# Patient Record
Sex: Female | Born: 1975 | Race: White | Hispanic: No | Marital: Married | Smoking: Never smoker
Health system: Southern US, Community
[De-identification: ages and names within clinical notes are randomized; demographics above are authoritative.]

## PROBLEM LIST (undated history)

## (undated) DIAGNOSIS — E079 Disorder of thyroid, unspecified: Secondary | ICD-10-CM

## (undated) DIAGNOSIS — F32A Depression, unspecified: Secondary | ICD-10-CM

## (undated) DIAGNOSIS — F419 Anxiety disorder, unspecified: Secondary | ICD-10-CM

## (undated) DIAGNOSIS — G4730 Sleep apnea, unspecified: Secondary | ICD-10-CM

## (undated) DIAGNOSIS — D649 Anemia, unspecified: Secondary | ICD-10-CM

## (undated) HISTORY — PX: TONSILLECTOMY: SUR1361

## (undated) HISTORY — PX: WRIST FRACTURE SURGERY: SHX121

## (undated) HISTORY — PX: FOOT SURGERY: SHX648

## (undated) HISTORY — PX: WISDOM TOOTH EXTRACTION: SHX21

## (undated) HISTORY — PX: CHOLECYSTECTOMY: SHX55

## (undated) HISTORY — PX: CARPAL TUNNEL RELEASE: SHX101

---

## 2007-07-22 HISTORY — PX: GASTRIC BYPASS: SHX52

## 2016-07-21 HISTORY — PX: ABDOMINAL HYSTERECTOMY: SHX81

## 2021-02-19 ENCOUNTER — Encounter: Payer: Self-pay | Admitting: Podiatry

## 2021-02-19 ENCOUNTER — Other Ambulatory Visit: Payer: Self-pay | Admitting: Podiatry

## 2021-02-19 ENCOUNTER — Ambulatory Visit (INDEPENDENT_AMBULATORY_CARE_PROVIDER_SITE_OTHER): Payer: 59 | Admitting: Podiatry

## 2021-02-19 ENCOUNTER — Other Ambulatory Visit: Payer: Self-pay

## 2021-02-19 ENCOUNTER — Ambulatory Visit (INDEPENDENT_AMBULATORY_CARE_PROVIDER_SITE_OTHER): Payer: 59

## 2021-02-19 DIAGNOSIS — M722 Plantar fascial fibromatosis: Secondary | ICD-10-CM | POA: Diagnosis not present

## 2021-02-19 DIAGNOSIS — M778 Other enthesopathies, not elsewhere classified: Secondary | ICD-10-CM

## 2021-02-19 MED ORDER — TRIAMCINOLONE ACETONIDE 40 MG/ML IJ SUSP
20.0000 mg | Freq: Once | INTRAMUSCULAR | Status: AC
  Administered 2021-02-19: 20 mg

## 2021-02-19 MED ORDER — METHYLPREDNISOLONE 4 MG PO TBPK: ORAL_TABLET | ORAL | 0 refills | Status: DC

## 2021-02-19 MED ORDER — MELOXICAM 15 MG PO TABS: 15.0000 mg | ORAL_TABLET | Freq: Every day | ORAL | 3 refills | Status: DC

## 2021-02-19 NOTE — Patient Instructions (Signed)

## 2021-02-19 NOTE — Progress Notes (Signed)
Subjective:  Patient ID: Kimberly Lara, female    DOB: 09/26/1975,  MRN: [id number] HPI Chief Complaint  Patient presents with   Foot Pain    Plantar heel right - aching x 6 weeks, AM pain, tried Advil, Tylenol, ice, heat, elevation, compression sock, ordered night splint off [employer] (it comes today), tried new shoes-nothing has helped, trouble with arch supportive shoes due to trauma to left foot years ago   New Patient (Initial Visit)    45 y.o. female presents with the above complaint.   ROS: Denies fever chills nausea vomiting muscle aches pains calf pain back pain chest pain shortness of breath.  No past medical history on file.   Current Outpatient Medications:    meloxicam (MOBIC) 15 MG tablet, Take 1 tablet (15 mg total) by mouth daily., Disp: 30 tablet, Rfl: 3   methylPREDNISolone (MEDROL DOSEPAK) 4 MG TBPK tablet, 6 day dose pack - take as directed, Disp: 21 tablet, Rfl: 0   Multiple Vitamins-Minerals (PRESERVISION AREDS 2+MULTI VIT PO), , Disp: , Rfl:    meclizine (ANTIVERT) 25 MG tablet, Take by mouth 2 (two) times daily., Disp: , Rfl:    Prenatal Vit-Fe Fumarate-FA (M-NATAL PLUS) 27-1 MG TABS, Take 1 tablet by mouth daily as needed., Disp: , Rfl:    SYNTHROID 100 MCG tablet, Take 100 mcg by mouth daily., Disp: , Rfl:    traZODone (DESYREL) 50 MG tablet, Take 100 mg by mouth at bedtime as needed., Disp: , Rfl:    venlafaxine XR (EFFEXOR-XR) 150 MG 24 hr capsule, Take 150 mg by mouth daily., Disp: , Rfl:   No Known Allergies Review of Systems Objective:  There were no vitals filed for this visit.  General: Well developed, nourished, in no acute distress, alert and oriented x3   Dermatological: Skin is warm, dry and supple bilateral. Nails x 10 are well maintained; remaining integument appears unremarkable at this time. There are no open sores, no preulcerative lesions, no rash or signs of infection present.  Vascular: Dorsalis Pedis artery and Posterior Tibial artery  pedal pulses are 2/4 bilateral with immedate capillary fill time. Pedal hair growth present. No varicosities and no lower extremity edema present bilateral.   Neruologic: Grossly intact via light touch bilateral. Vibratory intact via tuning fork bilateral. Protective threshold with Semmes Wienstein monofilament intact to all pedal sites bilateral. Patellar and Achilles deep tendon reflexes 2+ bilateral. No Babinski or clonus noted bilateral.   Musculoskeletal: No gross boney pedal deformities bilateral. No pain, crepitus, or limitation noted with foot and ankle range of motion bilateral. Muscular strength 5/5 in all groups tested bilateral.  Severe pain on palpation medial calcaneal tubercle majority of her pain is lateral right.  Gait: Unassisted, Nonantalgic.    Radiographs:  Radiographs taken today demonstrate osseously mature individual soft tissue increase in density at the plantar fascial cannula insertion site of the right heel consistent with Planter fasciitis.  No other significant abnormalities are identified no acute findings are noted.  Assessment & Plan:   Assessment: Planter fasciitis right.  Plan: I injected the right heel today 20 mg Kenalog 5 mg Marcaine point of maximal tenderness.  Started her on methylprednisolone to be followed by meloxicam.  She will return to the office in 1 month.  She already has a night splint coming to her house that she ordered online.  We discussed appropriate shoe gear stretching exercise ice therapy and shoe gear modifications we also discussed etiology pathology conservative surgical therapies.  Garrel Ridgel, DPM

## 2021-03-26 ENCOUNTER — Other Ambulatory Visit: Payer: Self-pay

## 2021-03-26 ENCOUNTER — Ambulatory Visit (INDEPENDENT_AMBULATORY_CARE_PROVIDER_SITE_OTHER): Payer: 59 | Admitting: Podiatry

## 2021-03-26 DIAGNOSIS — M722 Plantar fascial fibromatosis: Secondary | ICD-10-CM | POA: Diagnosis not present

## 2021-03-26 MED ORDER — TRIAMCINOLONE ACETONIDE 40 MG/ML IJ SUSP
20.0000 mg | Freq: Once | INTRAMUSCULAR | Status: AC
  Administered 2021-03-26: 20 mg

## 2021-03-26 NOTE — Progress Notes (Signed)
She presents today states that her right Planter fasciitis pain was doing much better but after finishing the oral medication is started to become painful once again.  She states that it is still better than it was.  Objective: Vital signs are stable she alert oriented x3 presents with flip-flops or sandal type shoes today.  Still has tenderness on palpation medial calcaneal tubercle for but primarily lateral calcaneal tubercle.  Assessment plan fasciitis some resolution.  Plan: Reinjected the lateral aspect of the plantar fascial heel today 20 mg Kenalog 5 mg Marcaine point of maximal tenderness.  Tolerated procedure well follow-up with me in 1 month.  Continue all other conservative therapies.

## 2021-03-26 NOTE — Patient Instructions (Signed)

## 2021-05-02 ENCOUNTER — Ambulatory Visit (INDEPENDENT_AMBULATORY_CARE_PROVIDER_SITE_OTHER): Payer: 59 | Admitting: Podiatry

## 2021-05-02 ENCOUNTER — Other Ambulatory Visit: Payer: Self-pay

## 2021-05-02 DIAGNOSIS — M722 Plantar fascial fibromatosis: Secondary | ICD-10-CM | POA: Diagnosis not present

## 2021-05-02 MED ORDER — TRIAMCINOLONE ACETONIDE 10 MG/ML IJ SUSP
10.0000 mg | Freq: Once | INTRAMUSCULAR | Status: AC
  Administered 2021-05-02: 10 mg

## 2021-05-02 NOTE — Patient Instructions (Signed)
If was nice to meet you today. If you have any questions or any further concerns, please feel fee to give me a call. You can call our office at [phone] or please feel fee to send me a message through MyChart.    While at your visit today you received a steroid injection in your foot or ankle to help with your pain. Along with having the steroid medication there is some "numbing" medication in the shot that you received. Due to this you may notice some numbness to the area for the next couple of hours.    The actually benefit from the steroid injection may take up to 2-7 days to see a difference. You may actually experience a small (as in 10%) INCREASE in pain in the first 24 hours---that is common. It would be best if you can ice the area today and take anti-inflammatory medications (such as Ibuprofen, Motrin, or Aleve) if you are able to take these medications. If you were prescribed another medication to help with the pain go ahead and start that medication today    Things to watch out for that you should contact us or a health care provider urgently would include: 1. Unusual (as in more than 10%) increase in pain 2. New fever > 101.5 3. New swelling or redness of the injected area.  4. Streaking of red lines around the area injected.  If you have any questions or concerns about this, please give our office a call at [phone].

## 2021-05-02 NOTE — Progress Notes (Signed)
Subjective: 45 year old female presents the office today for concerns of ongoing pain to the right heel.  She been on the care of Dr. Al Corpus and she is recent injections which helped when she gets them however the pain does continue.  She denies any numbness or tingling or any radiating pain.  She is doing the stretching, icing as well as using the brace.  She is previously had oral steroids as well as anti-inflammatories.   Objective: AAO x3, NAD DP/PT pulses palpable bilaterally, CRT less than 3 seconds Tenderness to palpation along the plantar medial tubercle of the calcaneus at the insertion of plantar fascia on the right foot. There is no pain along the course of the plantar fascia within the arch of the foot. Plantar fascia appears to be intact. There is no pain with lateral compression of the calcaneus or pain with vibratory sensation. There is no pain along the course or insertion of the achilles tendon. No other areas of tenderness to bilateral lower extremities.  No pain with calf compression, swelling, warmth, erythema  Assessment: Right heel pain, plantar fasciitis  Plan: -All treatment options discussed with the patient including all alternatives, risks, complications.  -Steroid injection performed.  Discussed with alcohol and mixture of 0.5 cc Kenalog 10, 0.5 cc of dexamethasone phosphate, 1 cc lidocaine plain was infiltrated into the area of maximal tenderness without complications.  Postinjection care discussed.  Tolerated well. -Order MRI given ongoing nature and she has failed conservative treatment and is potential surgical planning. -Patient encouraged to call the office with any questions, concerns, change in symptoms.   [name] DPM

## 2021-05-07 ENCOUNTER — Ambulatory Visit: Payer: 59 | Admitting: Podiatry

## 2021-05-30 ENCOUNTER — Ambulatory Visit
Admission: RE | Admit: 2021-05-30 | Discharge: 2021-05-30 | Disposition: A | Discharge status: Home / Self Care | Payer: 59 | Source: Ambulatory Visit | Attending: Podiatry | Admitting: Podiatry

## 2021-05-30 ENCOUNTER — Other Ambulatory Visit: Payer: Self-pay

## 2021-05-30 DIAGNOSIS — M722 Plantar fascial fibromatosis: Secondary | ICD-10-CM

## 2021-06-04 ENCOUNTER — Ambulatory Visit (INDEPENDENT_AMBULATORY_CARE_PROVIDER_SITE_OTHER): Payer: 59 | Admitting: Podiatry

## 2021-06-04 ENCOUNTER — Other Ambulatory Visit: Payer: Self-pay

## 2021-06-04 ENCOUNTER — Encounter: Payer: Self-pay | Admitting: Podiatry

## 2021-06-04 DIAGNOSIS — M722 Plantar fascial fibromatosis: Secondary | ICD-10-CM | POA: Diagnosis not present

## 2021-06-04 NOTE — Progress Notes (Signed)
She presents today for follow-up of her MRI states that the right foot still sore.  Objective: Vital signs are stable alert oriented x3 still has pain on palpation to the lateral band of the plantar fascia right.  No pain on palpation of the peroneal tendons.  Radiographs and MRI reviewed today demonstrating Planter fasciitis right.  Assessment: Planter fasciitis lateral band.  Plan: Since there is no tear of the plantar fascia I think physical therapy would be best for her so we will set that up with her somewhere near [facility].

## 2021-06-26 ENCOUNTER — Ambulatory Visit: Payer: 59 | Attending: Podiatry | Admitting: Physical Therapy

## 2021-06-26 ENCOUNTER — Encounter: Payer: Self-pay | Admitting: Physical Therapy

## 2021-06-26 ENCOUNTER — Other Ambulatory Visit: Payer: Self-pay

## 2021-06-26 DIAGNOSIS — M25671 Stiffness of right ankle, not elsewhere classified: Secondary | ICD-10-CM | POA: Diagnosis present

## 2021-06-26 DIAGNOSIS — M722 Plantar fascial fibromatosis: Secondary | ICD-10-CM | POA: Diagnosis not present

## 2021-06-26 DIAGNOSIS — M79671 Pain in right foot: Secondary | ICD-10-CM | POA: Diagnosis not present

## 2021-06-26 NOTE — Therapy (Signed)
[facility] [address]  [address] [place], [place], [address] Phone: [phone]   Fax:  [phone]  Physical Therapy Evaluation  Patient Details  Name: Kimberly Lara MRN: [id number] Date of Birth: [birth year] Referring Provider (PT): Ernestene Kiel DPM   Encounter Date: 06/26/2021   PT End of Session - 06/26/21 1149     Visit Number 1    Date for PT Re-Evaluation 08/07/21    Authorization Type [employer] Nap    PT Start Time 1150    PT Stop Time 1229    PT Time Calculation (min) 39 min    Activity Tolerance Patient tolerated treatment well    Behavior During Therapy [facility] for tasks assessed/performed             History reviewed. No pertinent past medical history.  History reviewed. No pertinent surgical history.  There were no vitals filed for this visit.    Subjective Assessment - 06/26/21 1150     Subjective Four months ago patient started having really bad heel pain. Had injections for 3 months which were progressively less effective. shooting pains have started. Been in walkiing boot and splint.    Pertinent History obese    Diagnostic tests MRI - mild bone spurs, no tendon damage    Patient Stated Goals to decrease pain with walking    Currently in Pain? Yes    Pain Score 7     Pain Location Heel    Pain Orientation Right    Pain Descriptors / Indicators Pressure;Shooting    Pain Type Acute pain    Pain Onset More than a month ago    Pain Frequency Intermittent    Aggravating Factors  WB    Pain Relieving Factors rest                [facility] PT Assessment - 06/26/21 0001       Assessment   Medical Diagnosis right plantar faciitis    Referring Provider (PT) Max Hyatt DPM    Onset Date/Surgical Date 02/18/21    Next MD Visit january    Prior Therapy for left ankle      Precautions   Precautions None      Restrictions   Weight Bearing Restrictions No      Balance Screen   Has the patient fallen  in the past 6 months No    Has the patient had a decrease in activity level because of a fear of falling?  No    Is the patient reluctant to leave their home because of a fear of falling?  No      Home Environment   Living Environment Private residence    Additional Comments has 2nd level but doesn't need to access      Prior Function   Level of Independence Independent    Vocation Full time employment    Vocation Requirements computer work    Leisure limited with grocery shopping does online      Observation/Other Assessments   Focus on Therapeutic Outcomes (FOTO)  31 (predicted 56)      Posture/Postural Control   Posture Comments Stands in bil hip ER, pes planus right compared to left      ROM / Strength   AROM / PROM / Strength AROM;PROM;Strength      AROM   Overall AROM Comments Full great toe extension    AROM Assessment Site Ankle    Right/Left Ankle Right  Right Ankle Dorsiflexion -5    Right Ankle Plantar Flexion 57    Right Ankle Inversion 34    Right Ankle Eversion 12      PROM   PROM Assessment Site Ankle    Right/Left Ankle Right    Right Ankle Dorsiflexion 3    Right Ankle Plantar Flexion 68    Right Ankle Inversion 38    Right Ankle Eversion 20      Strength   Overall Strength Comments unable to do single leg heel left on right, Rt ankle inv 4+/5, else  5/5      Palpation   Patella mobility calcaneal eversion limited on right    Palpation comment marked tenderness of right heel; mod tenderness of right medial plantar fascia and along distal medial tibia      Ambulation/Gait   Ambulation/Gait Yes    Ambulation/Gait Assistance 7: Independent    Ambulation Distance (Feet) 20 Feet    Assistive device None    Gait Pattern Decreased step length - left;Decreased stance time - right;Decreased stride length;Decreased dorsiflexion - right;Wide base of support    Ambulation Surface Level;Indoor                        Objective measurements  completed on examination: See above findings.                PT Education - 06/26/21 1244     Education Details HEP; POC; importance of compliance; discussed anti-inflammatory diet briefly    Person(s) Educated Patient    Methods Explanation;Demonstration;Handout    Comprehension Verbalized understanding;Returned demonstration                 PT Long Term Goals - 06/26/21 1308       PT LONG TERM GOAL #1   Title decreased right heel pain by >=75% with ambulation    Time 6    Period Weeks    Status New    Target Date 08/07/21      PT LONG TERM GOAL #2   Title improved active right ankle DF to >= 5 deg to help normalize gait    Time 6    Period Weeks    Status New      PT LONG TERM GOAL #3   Title improved right ankle strength to 5/5    Time 6    Period Weeks    Status New      PT LONG TERM GOAL #4   Title ind with HEP to maintain gains made in PT    Time 6    Period Weeks    Status New      PT LONG TERM GOAL #5   Title improved FS score to 56 showing functional improvement    Time 6    Period Weeks    Status New                    Plan - 06/26/21 1259     Clinical Impression Statement Patient presents with c/o of right heel pain x 4 months with insidious onset. She has been treated with injections, stretching, a night splint and walking boot with initial relief but then return of sx. She is hoping to avoid surgery. She has limitations in right ankle DF actively and passively and weakness with right ankle plantarflexion and in her medial arch. She is limited with all WBing activity affecting her ADLs incuding grocery shopping  which she must do online now. She will benefit from skilled PT to address these deficits.    Personal Factors and Comorbidities Comorbidity 1    Comorbidities obesity    Examination-Activity Limitations Locomotion Level;Stairs    Examination-Participation Restrictions Community Activity    Stability/Clinical  Decision Making Stable/Uncomplicated    Clinical Decision Making Low    Rehab Potential Good    PT Frequency 2x / week    PT Duration 6 weeks    PT Treatment/Interventions ADLs/Self Care Home Management;Aquatic Therapy;Cryotherapy;Electrical Stimulation;Iontophoresis 4mg /ml Dexamethasone;Ultrasound;Neuromuscular re-education;Therapeutic exercise;Therapeutic activities;Patient/family education;Manual techniques;Dry needling;Taping    PT Next Visit Plan DN/man to right plantar muscles/possibly gastroc/soleus, work on eccentric strength, arch strength; modalities prn (ionto not covered by )    PT Home Exercise Plan [email or web address]    Consulted and Agree with Plan of Care Patient             Patient will benefit from skilled therapeutic intervention in order to improve the following deficits and impairments:  Abnormal gait, Decreased range of motion, Difficulty walking, Increased fascial restricitons, Pain, Increased muscle spasms, Decreased activity tolerance, Impaired flexibility, Decreased strength, Postural dysfunction, Hypomobility  Visit Diagnosis: Pain in right foot - Plan: PT plan of care cert/re-cert  Stiffness of right ankle, not elsewhere classified - Plan: PT plan of care cert/re-cert     Problem List There are no problems to display for this patient.  Google Atziry Baranski PT 06/26/2021, 1:16 PM  [facility] [address]  [address] [place], [place], [place] Phone: [phone]   Fax:  [phone]  Name: Kimberly Lara MRN: Prince Solian Date of Birth: 25-Apr-1976

## 2021-06-26 NOTE — Patient Instructions (Signed)
Access Code: [email or web address] URL: [email or web address] Date: 06/26/2021 Prepared by: Raynelle Fanning  Exercises Gastroc Stretch on Wall - 2 x daily - 7 x weekly - 1 sets - 3 reps - 30-60 sec hold Arch Stretch - 2 x daily - 7 x weekly - 1 sets - 3 reps - 60 sec hold Seated Arch Lifts - 3 x daily - 7 x weekly - 1-2 sets - 10 reps - 5 sec hold Heel Raises with Counter Support - 1 x daily - 7 x weekly - 3 sets - 10 reps Standing Heel Raises - 1 x daily - 4 x weekly - 2 sets - 10 reps  Patient Education Trigger Point Dry Needling

## 2021-06-28 ENCOUNTER — Ambulatory Visit: Payer: 59 | Admitting: Physical Therapy

## 2021-06-28 ENCOUNTER — Other Ambulatory Visit: Payer: Self-pay | Admitting: Podiatry

## 2021-07-01 ENCOUNTER — Other Ambulatory Visit: Payer: Self-pay

## 2021-07-01 ENCOUNTER — Ambulatory Visit: Payer: 59 | Admitting: Physical Therapy

## 2021-07-01 DIAGNOSIS — M25671 Stiffness of right ankle, not elsewhere classified: Secondary | ICD-10-CM

## 2021-07-01 DIAGNOSIS — M79671 Pain in right foot: Secondary | ICD-10-CM | POA: Diagnosis not present

## 2021-07-01 NOTE — Therapy (Signed)
[facility] Outpatient Rehabilitation [facility] [address]  [address] [place], [place], [address] Phone: [phone]   Fax:  [phone]  Physical Therapy Treatment  Patient Details  Name: Kimberly Lara MRN: [id number] Date of Birth: Nov 17, 1975 Referring Provider (PT): Ernestene Kiel DPM   Encounter Date: 07/01/2021   PT End of Session - 07/01/21 1151     Visit Number 2    Date for PT Re-Evaluation 08/07/21    Authorization Type [employer] Nap    PT Start Time 1149    PT Stop Time 1230    PT Time Calculation (min) 41 min    Activity Tolerance Patient tolerated treatment well    Behavior During Therapy [facility] for tasks assessed/performed             No past medical history on file.  No past surgical history on file.  There were no vitals filed for this visit.   Subjective Assessment - 07/01/21 1152     Subjective Pt had flu and covid shot so didn't do any exercises this weekend.    Diagnostic tests MRI - mild bone spurs, no tendon damage    Patient Stated Goals to decrease pain with walking    Currently in Pain? Yes    Pain Score 7     Pain Location Heel    Pain Orientation Right    Pain Descriptors / Indicators Shooting;Pressure    Pain Type Acute pain                               [facility] Adult PT Treatment/Exercise - 07/01/21 0001       Exercises   Exercises Ankle      Manual Therapy   Manual Therapy Soft tissue mobilization;Myofascial release;Taping    Manual therapy comments skilled palpation and monitoring of soft tissues during DN    Soft tissue mobilization to right gastroc/soleus    Myofascial Release to right plantar fascia    Kinesiotex Create Space      Kinesiotix   Create Space for plantar fascia off load                     PT Education - 07/01/21 1604     Education Details Verbally reviewed HEP; reviewed DN aftercare, tape precautions and how to remove    Person(s) Educated Patient    Methods  Explanation    Comprehension Verbalized understanding                 PT Long Term Goals - 06/26/21 1308       PT LONG TERM GOAL #1   Title decreased right heel pain by >=75% with ambulation    Time 6    Period Weeks    Status New    Target Date 08/07/21      PT LONG TERM GOAL #2   Title improved active right ankle DF to >= 5 deg to help normalize gait    Time 6    Period Weeks    Status New      PT LONG TERM GOAL #3   Title improved right ankle strength to 5/5    Time 6    Period Weeks    Status New      PT LONG TERM GOAL #4   Title ind with HEP to maintain gains made in PT    Time 6    Period  Weeks    Status New      PT LONG TERM GOAL #5   Title improved FS score to 56 showing functional improvement    Time 6    Period Weeks    Status New                   Plan - 07/01/21 1555     Clinical Impression Statement Patient non compliant with HEP so far due to response to covid booster and flu shot. Initial trial of DN to quadratus plantae today and gastroc/soleus. Patient reported immediate improvement in tightness upon standing in the calf. We also attempted off loading taping to plantar fascia using KT tape due poor adhesiveness of athletic tape. May retry off load tape again next visit with athletic tape.    PT Frequency 2x / week    PT Duration 6 weeks    PT Treatment/Interventions ADLs/Self Care Home Management;Aquatic Therapy;Cryotherapy;Electrical Stimulation;Iontophoresis 4mg /ml Dexamethasone;Ultrasound;Neuromuscular re-education;Therapeutic exercise;Therapeutic activities;Patient/family education;Manual techniques;Dry needling;Taping    PT Next Visit Plan assess DN/man to right plantar muscles/possibly gastroc/soleus, work on eccentric strength, arch strength; modalities prn (ionto not covered by )    PT Home Exercise Plan [email or web address]    Consulted and Agree with Plan of Care Patient             Patient will benefit from skilled  therapeutic intervention in order to improve the following deficits and impairments:  Abnormal gait, Decreased range of motion, Difficulty walking, Increased fascial restricitons, Pain, Increased muscle spasms, Decreased activity tolerance, Impaired flexibility, Decreased strength, Postural dysfunction, Hypomobility  Visit Diagnosis: Pain in right foot  Stiffness of right ankle, not elsewhere classified     Problem List There are no problems to display for this patient.  Google, PT 07/01/2021, 4:08 PM  [facility] [address]  [address] [place], [place], [place] Phone: [phone]   Fax:  [phone]  Name: Kimberly Lara MRN: Prince Solian Date of Birth: [birth year]

## 2021-07-04 ENCOUNTER — Other Ambulatory Visit: Payer: Self-pay

## 2021-07-04 ENCOUNTER — Encounter: Payer: Self-pay | Admitting: Physical Therapy

## 2021-07-04 ENCOUNTER — Ambulatory Visit: Payer: 59 | Admitting: Physical Therapy

## 2021-07-04 DIAGNOSIS — M79671 Pain in right foot: Secondary | ICD-10-CM

## 2021-07-04 DIAGNOSIS — M25671 Stiffness of right ankle, not elsewhere classified: Secondary | ICD-10-CM

## 2021-07-04 NOTE — Therapy (Signed)
[facility] [address]  [address] [place], [place], [address] Phone: [phone]   Fax:  [phone]  Physical Therapy Treatment  Patient Details  Name: Kimberly Lara MRN: [id number] Date of Birth: 22-Jan-1976 Referring Provider (PT): Ernestene Kiel DPM   Encounter Date: 07/04/2021   PT End of Session - 07/04/21 1305     Visit Number 3    Date for PT Re-Evaluation 08/07/21    Authorization Type Aetna Nap    PT Start Time 1305    PT Stop Time 1346    PT Time Calculation (min) 41 min    Activity Tolerance Patient tolerated treatment well    Behavior During Therapy [facility] for tasks assessed/performed             History reviewed. No pertinent past medical history.  History reviewed. No pertinent surgical history.  There were no vitals filed for this visit.   Subjective Assessment - 07/04/21 1308     Subjective had relief then tape came off; Retaped at home and felt better. hurts when it's off    Patient Stated Goals to decrease pain with walking    Currently in Pain? Yes    Pain Score 5     Pain Location Foot    Pain Orientation Right                               [facility] Adult PT Treatment/Exercise - 07/04/21 0001       Exercises   Exercises Ankle      Manual Therapy   Manual Therapy Soft tissue mobilization    Manual therapy comments skilled palpation and monitoring of soft tissues during DN    Soft tissue mobilization to right gastroc/soleus    Kinesiotex Create Space      Kinesiotix   Create Space for plantar fascia off load   used athletic tape with pretape base     Ankle Exercises: Standing   Heel Raises Limitations 6 reps bil with partial ROM left then fatigued      Ankle Exercises: Seated   Heel Raises Both;15 reps   1x15 3 sec up, 3 sec hold, 3 sec down   Toe Raise 10 reps    Other Seated Ankle Exercises arch sets 10 sec hold 5    Other Seated Ankle Exercises ankle inversion  RTB x 15                     PT Education - 07/04/21 1542     Education Details advised to acquire tacky spray if plan to continue taping at home; hold arch isometrics 10 sec; modify heel raises to sitting    Person(s) Educated Patient    Methods Explanation    Comprehension Verbalized understanding                 PT Long Term Goals - 06/26/21 1308       PT LONG TERM GOAL #1   Title decreased right heel pain by >=75% with ambulation    Time 6    Period Weeks    Status New    Target Date 08/07/21      PT LONG TERM GOAL #2   Title improved active right ankle DF to >= 5 deg to help normalize gait    Time 6    Period Weeks    Status New  PT LONG TERM GOAL #3   Title improved right ankle strength to 5/5    Time 6    Period Weeks    Status New      PT LONG TERM GOAL #4   Title ind with HEP to maintain gains made in PT    Time 6    Period Weeks    Status New      PT LONG TERM GOAL #5   Title improved FS score to 56 showing functional improvement    Time 6    Period Weeks    Status New                   Plan - 07/04/21 1543     Clinical Impression Statement Patient reports reief with taping but tape came off right away. She and her husband retaped the next day and it also came off. Able to tolerate HEP with tape. Overall pain down after manual therapy. We addressed additional TPs today in lateral arch and gastroc/soleus with positive results. Standing heel raises are painful and she has limited range so these were modified to sitting.    PT Treatment/Interventions ADLs/Self Care Home Management;Aquatic Therapy;Cryotherapy;Electrical Stimulation;Iontophoresis 4mg /ml Dexamethasone;Ultrasound;Neuromuscular re-education;Therapeutic exercise;Therapeutic activities;Patient/family education;Manual techniques;Dry needling;Taping    PT Next Visit Plan assess DN/man to right plantar muscles/possibly gastroc/soleus, work on eccentric strength, arch  strength; modalities prn (ionto not covered by )    PT Home Exercise Plan [email or web address]             Patient will benefit from skilled therapeutic intervention in order to improve the following deficits and impairments:  Abnormal gait, Decreased range of motion, Difficulty walking, Increased fascial restricitons, Pain, Increased muscle spasms, Decreased activity tolerance, Impaired flexibility, Decreased strength, Postural dysfunction, Hypomobility  Visit Diagnosis: Pain in right foot  Stiffness of right ankle, not elsewhere classified     Problem List There are no problems to display for this patient.  Google, PT 07/04/2021, 3:47 PM  [facility] [address]  [address] [place], [place], [place] Phone: [phone]   Fax:  [phone]  Name: Kimberly Lara MRN: Prince Solian Date of Birth: [birth year]

## 2021-07-08 ENCOUNTER — Encounter: Payer: Self-pay | Admitting: Physical Therapy

## 2021-07-08 ENCOUNTER — Ambulatory Visit: Payer: 59 | Admitting: Physical Therapy

## 2021-07-08 ENCOUNTER — Other Ambulatory Visit: Payer: Self-pay

## 2021-07-08 DIAGNOSIS — M25671 Stiffness of right ankle, not elsewhere classified: Secondary | ICD-10-CM

## 2021-07-08 DIAGNOSIS — M79671 Pain in right foot: Secondary | ICD-10-CM | POA: Diagnosis not present

## 2021-07-08 NOTE — Therapy (Signed)
[facility] [address]  [place], [place], [address] Phone: [phone]   Fax:  [phone]  Physical Therapy Treatment  Patient Details  Name: Kimberly Lara MRN: [id number] Date of Birth: Jul 09, 1976 Referring Provider (PT): Tyson Dense DPM   Encounter Date: 07/08/2021   PT End of Session - 07/08/21 1150     Visit Number 4    Date for PT Re-Evaluation 08/07/21    Authorization Type Aetna Nap    PT Start Time 1150    PT Stop Time 1228    PT Time Calculation (min) 38 min    Activity Tolerance Patient tolerated treatment well    Behavior During Therapy [facility] for tasks assessed/performed             History reviewed. No pertinent past medical history.  History reviewed. No pertinent surgical history.  There were no vitals filed for this visit.   Subjective Assessment - 07/08/21 1150     Subjective Not too bad today since I havent done much. No change in pain relief in heel with sitting vs. standing TE    Patient Stated Goals to decrease pain with walking    Currently in Pain? Yes    Pain Score 2     Pain Location Heel    Pain Orientation Right    Pain Descriptors / Indicators Shooting;Pressure    Pain Type Acute pain                [facility] PT Assessment - 07/08/21 0001       AROM   AROM Assessment Site Ankle    Right/Left Ankle Right    Right Ankle Dorsiflexion 5      Strength   Overall Strength Comments right ankle inv 5/5                           [facility] Adult PT Treatment/Exercise - 07/08/21 0001       Modalities   Modalities Ultrasound      Ultrasound   Ultrasound Location right heel    Ultrasound Parameters 1.5 w/cm2 3.37mz cont x 8 min    Ultrasound Goals Pain      Manual Therapy   Manual Therapy Soft tissue mobilization    Soft tissue mobilization manual and IASTM to right plantar fascia and heel      Ankle Exercises: Seated   Other Seated Ankle Exercises  arch sets 10 sec hold 10    Other Seated Ankle Exercises blue band x 5 PF   no difference in heel pain     Ankle Exercises: Standing   Heel Raises Both;3 seconds    Heel Raises Limitations 6 reps before heel pain started                          PT Long Term Goals - 07/08/21 2024       PT LONG TERM GOAL #1   Title decreased right heel pain by >=75% with ambulation    Status On-going      PT LONG TERM GOAL #2   Title improved active right ankle DF to >= 5 deg to help normalize gait    Status Achieved      PT LONG TERM GOAL #3   Title improved right ankle strength to 5/5    Baseline still limited with single leg DF    Status Partially  Met      PT LONG TERM GOAL #4   Title ind with HEP to maintain gains made in PT    Status On-going                   Plan - 07/08/21 1232     Clinical Impression Statement Initial trial of [place] to right heel and plantar fascia with good response. Kimberly Lara was able to complete 6 reps of heel raises before onset of pain. ROM LTG has been met, however pain still has not changed.    PT Frequency 2x / week    PT Duration 6 weeks    PT Treatment/Interventions ADLs/Self Care Home Management;Aquatic Therapy;Cryotherapy;Electrical Stimulation;Iontophoresis 45m/ml Dexamethasone;Ultrasound;Neuromuscular re-education;Therapeutic exercise;Therapeutic activities;Patient/family education;Manual techniques;Dry needling;Taping    PT Next Visit Plan assess [place] continue as indicated, work on eccentric strength, arch strength; modalities prn (ionto not covered by [employer]    [place] Agree with Plan of Care Patient             Patient will benefit from skilled therapeutic intervention in order to improve the following deficits and impairments:  Abnormal gait, Decreased range of motion, Difficulty walking, Increased fascial restricitons, Pain, Increased muscle spasms, Decreased activity tolerance,  Impaired flexibility, Decreased strength, Postural dysfunction, Hypomobility  Visit Diagnosis: Pain in right foot  Stiffness of right ankle, not elsewhere classified     Problem List There are no problems to display for this patient.   JMadelyn Flavors PT 07/08/2021, 8:27 PM  [facility] [place] [address]Phone: [phone]  Fax:  [phone] Name: Kimberly RehbergMRN: [id number] of Birth: [birth year]

## 2021-07-10 ENCOUNTER — Encounter: Payer: 59 | Admitting: Physical Therapy

## 2021-07-24 ENCOUNTER — Encounter: Payer: 59 | Admitting: Physical Therapy

## 2021-07-29 ENCOUNTER — Other Ambulatory Visit: Payer: Self-pay

## 2021-07-29 ENCOUNTER — Ambulatory Visit: Payer: 59 | Attending: Podiatry

## 2021-07-29 DIAGNOSIS — M79671 Pain in right foot: Secondary | ICD-10-CM | POA: Insufficient documentation

## 2021-07-29 DIAGNOSIS — M25671 Stiffness of right ankle, not elsewhere classified: Secondary | ICD-10-CM | POA: Insufficient documentation

## 2021-07-29 NOTE — Therapy (Signed)
[facility] [address]  [place], [place], [address] Phone: [phone]   Fax:  [phone]  Physical Therapy Treatment  Patient Details  Name: Kimberly Lara MRN: [id number] Date of Birth: [birth year] Referring Provider (PT): Tyson Dense DPM   Encounter Date: 07/29/2021   PT End of Session - 07/29/21 1200     Visit Number 5    Date for PT Re-Evaluation 08/07/21    Authorization Type Aetna Nap    PT Start Time 1104    PT Stop Time 1145    PT Time Calculation (min) 41 min    Activity Tolerance Patient tolerated treatment well    Behavior During Therapy [facility] for tasks assessed/performed             History reviewed. No pertinent past medical history.  History reviewed. No pertinent surgical history.  There were no vitals filed for this visit.   Subjective Assessment - 07/29/21 1106     Subjective Went out to a couple of stores on Saturday and my foot was bothering me a lot.    Pertinent History obese    Diagnostic tests MRI - mild bone spurs, no tendon damage    Patient Stated Goals to decrease pain with walking    Currently in Pain? Yes    Pain Score 4    7/10 when standing   Pain Location Heel    Pain Orientation Right    Pain Descriptors / Indicators Shooting;Pressure    Pain Type Acute pain                               [facility] Adult PT Treatment/Exercise - 07/29/21 0001       Manual Therapy   Manual Therapy Soft tissue mobilization    Soft tissue mobilization manual and IASTM to right plantar fascia and heel      Ankle Exercises: Seated   Towel Crunch --   20 reps   Towel Inversion/Eversion --   R EV 10 reps   Heel Raises Right;10 reps   with slow eccentric control   Other Seated Ankle Exercises PF with blue TB 20 reps      Ankle Exercises: Stretches   Plantar Fascia Stretch 2 reps;20 seconds   passive   Gastroc Stretch 2 reps;20 seconds   passive                          PT Long Term Goals - 07/08/21 2024       PT LONG TERM GOAL #1   Title decreased right heel pain by >=75% with ambulation    Status On-going      PT LONG TERM GOAL #2   Title improved active right ankle DF to >= 5 deg to help normalize gait    Status Achieved      PT LONG TERM GOAL #3   Title improved right ankle strength to 5/5    Baseline still limited with single leg DF    Status Partially Met      PT LONG TERM GOAL #4   Title ind with HEP to maintain gains made in PT    Status On-going                   Plan - 07/29/21 1201     Clinical Impression Statement Pt still c/o heel pain with  WB mostly and that her pain increased over the weekend. She feels like to [place] and STM has been helping but notes constant pain in her heel that limits her walking. She has high arches on the R but noted that her L arch is higher than her R. She had no complaints with the interventions today. We progressed exercises to her tolerance.    Personal Factors and Comorbidities Comorbidity 1    Comorbidities obesity    PT Frequency 2x / week    PT Duration 6 weeks    PT Treatment/Interventions ADLs/Self Care Home Management;Aquatic Therapy;Cryotherapy;Electrical Stimulation;Iontophoresis 16m/ml Dexamethasone;Ultrasound;Neuromuscular re-education;Therapeutic exercise;Therapeutic activities;Patient/family education;Manual techniques;Dry needling;Taping    PT Next Visit Plan assess [place] continue as indicated, work on eccentric strength, arch strength; modalities prn (ionto not covered by [employer]    [place] Agree with Plan of Care Patient             Patient will benefit from skilled therapeutic intervention in order to improve the following deficits and impairments:  Abnormal gait, Decreased range of motion, Difficulty walking, Increased fascial restricitons, Pain, Increased muscle spasms, Decreased activity tolerance,  Impaired flexibility, Decreased strength, Postural dysfunction, Hypomobility  Visit Diagnosis: Pain in right foot  Stiffness of right ankle, not elsewhere classified     Problem List There are no problems to display for this patient.   BArtist Pais PTA 07/29/2021, 12:02 PM  [facility] [place] [address]Phone: [phone]  Fax:  [phone] Name: AJoell BuergerMRN: [id number] of Birth: [birth year]

## 2021-07-31 ENCOUNTER — Ambulatory Visit: Payer: 59 | Admitting: Physical Therapy

## 2021-08-06 ENCOUNTER — Encounter: Payer: Self-pay | Admitting: Physical Therapy

## 2021-08-06 ENCOUNTER — Ambulatory Visit: Payer: 59 | Admitting: Physical Therapy

## 2021-08-06 ENCOUNTER — Other Ambulatory Visit: Payer: Self-pay

## 2021-08-06 DIAGNOSIS — M79671 Pain in right foot: Secondary | ICD-10-CM | POA: Diagnosis not present

## 2021-08-06 DIAGNOSIS — M25671 Stiffness of right ankle, not elsewhere classified: Secondary | ICD-10-CM

## 2021-08-06 NOTE — Patient Instructions (Signed)
° °  Access Code: [email or web address] URL: [email or web address] Date: 08/06/2021 Prepared by: Glenetta Hew  Exercises Gastroc Stretch on Wall - 2 x daily - 7 x weekly - 1 sets - 3 reps - 30-60 sec hold Arch Stretch - 2 x daily - 7 x weekly - 1 sets - 3 reps - 60 sec hold Seated Arch Lifts - 3 x daily - 7 x weekly - 1-2 sets - 10 reps - 5 sec hold Heel Raises with Counter Support - 1 x daily - 7 x weekly - 3 sets - 10 reps Standing Heel Raises - 1 x daily - 4 x weekly - 2 sets - 10 reps Seated Great Toe Extension - 1 x daily - 7 x weekly - 2 sets - 10 reps - 3 sec hold Seated Lesser Toes Extension - 1 x daily - 7 x weekly - 2 sets - 10 reps - 3 sec hold Toe Flexion with Resistance - 1 x daily - 7 x weekly - 2 sets - 10 reps - 3 sec hold Long Sitting Eccentric Ankle Plantar Flexion with Resistance - 1 x daily - 7 x weekly - 2 sets - 10 reps - 3 sec hold  Patient Education Trigger Point Dry Needling

## 2021-08-06 NOTE — Therapy (Addendum)
[facility] Outpatient Rehabilitation [facility] [address]  [address] [place], [place], [address] Phone: [phone]   Fax:  [phone]  Physical Therapy Treatment / Progress Note / Recert / Discharge Summary  Patient Details  Name: Kimberly Lara MRN: [id number] Date of Birth: [birth year] Referring Provider (PT): Max Maxwell DPM  Progress Note  Reporting Period 06/26/2021 to 08/06/2021  See note below for Objective Data and Assessment of Progress/Goals.     Encounter Date: 08/06/2021   PT End of Session - 08/06/21 0851     Visit Number 6    Date for PT Re-Evaluation 09/03/21    Authorization Type Aetna Nap    PT Start Time [phone]    PT Stop Time 0932    PT Time Calculation (min) 41 min    Activity Tolerance Patient tolerated treatment well    Behavior During Therapy [facility] for tasks assessed/performed             History reviewed. No pertinent past medical history.  History reviewed. No pertinent surgical history.  There were no vitals filed for this visit.   Subjective Assessment - 08/06/21 0853     Subjective Pt reports PT has not made a significant change in her pain. Noted some potential benefit from [place]. Feels that her calves are more flexible but still has pain deep in her R heel all the time, worse in weight bearing.    Pertinent History obese - 450+ lbs as of last recorded weight    Diagnostic tests MRI - mild bone spurs, no tendon damage    Patient Stated Goals to decrease pain with walking    Currently in Pain? Yes    Pain Score 4    1/10 at rest when not moving foot, 4-5/10 with movement in sitting, 7-8/10 in standing   Pain Location Heel    Pain Orientation Right   plantar surface near calcaneal tuberosity   Pain Descriptors / Indicators Constant   "deep"   Pain Type Acute pain    Pain Radiating Towards occasional shooting pain on outer ankle    Pain Onset More than a month ago   ~August 2022   Pain Frequency Intermittent    Aggravating  Factors  moving ankle, increases in WB    Pain Relieving Factors nothing other than being off feet, maybe [place]                [facility] PT Assessment - 08/06/21 0851       Assessment   Medical Diagnosis Right plantar faciitis    Referring Provider (PT) Max Hyatt DPM    Onset Date/Surgical Date 02/18/21    Next MD Visit 08/08/21      Prior Function   Level of Independence Independent    Vocation Full time employment    Print production planner work    Leisure limited with grocery shopping does online      Observation/Other Assessments   Focus on Therapeutic Outcomes (FOTO)  38 (improved from 31; predicted 56)      AROM   Right Ankle Dorsiflexion 6    Right Ankle Plantar Flexion 57    Right Ankle Inversion 36    Right Ankle Eversion 18      Strength   Strength Assessment Site Ankle    Right/Left Ankle Right    Right Ankle Dorsiflexion 5/5    Right Ankle Plantar Flexion 3/5   only able to attempt 1 SLS heel raise - limited d/t  pain   Right Ankle Inversion 5/5    Right Ankle Eversion 5/5                           [facility] Adult PT Treatment/Exercise - 08/06/21 0001       Exercises   Exercises Ankle      Ankle Exercises: Seated   Other Seated Ankle Exercises R toe yoga (alt great & kesser toe extension) x 10      Ankle Exercises: Supine   T-Band Longsitting R ankle PF with blue TB 2 x 10, cues for slow eccentric control    Other Supine Ankle Exercises Longstiting red TB R toe curls 2 x 10                     PT Education - 08/06/21 0930     Education Details HEP update - Access Code: [email or web address]; Progress with PT and plan for recert    Person(s) Educated Patient    Methods Explanation;Demonstration;Verbal cues;Handout    Comprehension Verbalized understanding;Verbal cues required;Returned demonstration;Need further instruction                 PT Long Term Goals - 08/06/21 0903       PT LONG TERM GOAL #1   Title decreased  right heel pain by >=75% with ambulation    Status On-going   08/06/21 - only 10-20% improvement in pain   Target Date 09/03/21      PT LONG TERM GOAL #2   Title improved active right ankle DF to >= 5 deg to help normalize gait    Status Achieved      PT LONG TERM GOAL #3   Title improved right ankle strength to 5/5    Baseline still limited with single leg DF    Status Partially Met   08/06/21 - met except PF   Target Date 09/03/21      PT LONG TERM GOAL #4   Title ind with HEP to maintain gains made in PT    Status On-going    Target Date 09/03/21      PT LONG TERM GOAL #5   Title improved FS score to 56 showing functional improvement    Baseline 31    Status On-going   08/06/21 - improved to 38   Target Date 09/03/21                   Plan - 08/06/21 0932     Clinical Impression Statement Kimberly Lara reports 70-80% improvement in distal LE flexibility but only 10-20% improvement in pain thus far with PT, with foot FOTO improved from 31 to 38. She feels that the DN definitely helped with her calf and ankle tightness and flexibility but minimal effect on pain. She feels [place] may have helped with pain for a few days but then had a very busy weekend and pain was re-exacerbated - she would like to try further [place] to see if she notes further benefit. She feels comfortable with the current HEP and new exercises added today, as well as instruction in ice water bottle massage in addition to golf ball massage that she has already been performing. LTGs met for ROM and partially met for ankle strength, with remaining LTGs still ongoing. Will recommend recert for 2x/wk for up to 4 wks to attempt to further improve pain control and strength for improved activity tolerance.    Personal Factors  and Comorbidities Comorbidity 1    Comorbidities obesity - 450+ lbs as of last recorded weight    Examination-Activity Limitations Locomotion Level;Stairs    Examination-Participation Restrictions Community  Activity    Rehab Potential Good    PT Frequency 2x / week    PT Duration 4 weeks    PT Treatment/Interventions ADLs/Self Care Home Management;Aquatic Therapy;Cryotherapy;Electrical Stimulation;Iontophoresis 4mg /ml Dexamethasone;Ultrasound;Neuromuscular re-education;Therapeutic exercise;Therapeutic activities;Patient/family education;Manual techniques;Dry needling;Taping    PT Next Visit Plan [place] to R heel, work on eccentric strength, arch strength; modalities prn (ionto not covered by [employer])    PT Home Exercise Plan [email or web address] (12/7, updated 1/17)    Consulted and Agree with Plan of Care Patient             Patient will benefit from skilled therapeutic intervention in order to improve the following deficits and impairments:  Abnormal gait, Decreased range of motion, Difficulty walking, Increased fascial restricitons, Pain, Increased muscle spasms, Decreased activity tolerance, Impaired flexibility, Decreased strength, Postural dysfunction, Hypomobility  Visit Diagnosis: Pain in right foot  Stiffness of right ankle, not elsewhere classified     Problem List There are no problems to display for this patient.   Marry Guan, PT 08/06/2021, 11:37 AM  [facility] [address]  [address] [place], [place], [address] Phone: [phone]   Fax:  [phone]  Name: Karrine Figaro MRN: [id number] Date of Birth: 26-Feb-1976    PHYSICAL THERAPY DISCHARGE SUMMARY  Visits from Start of Care: 6  Current functional level related to goals / functional outcomes:   Refer to above clinical impression for status as of last visit on 08/06/2021. Patient was scheduled for surgery on 08/23/2021 and no new orders received post-op, therefore will proceed with discharge from PT for this episode.   Remaining deficits:   As above. Pt did not return to PT.    Education / Equipment:   HEP   Patient agrees to discharge. Patient goals were partially  met. Patient is being discharged due to a change in medical status.  Marry Guan, PT, MPT 10/07/21, 1:46 PM  [facility] [address]  [address] [place], [place], [address] Phone: [phone]   Fax:  [phone]

## 2021-08-08 ENCOUNTER — Ambulatory Visit (INDEPENDENT_AMBULATORY_CARE_PROVIDER_SITE_OTHER): Payer: 59 | Admitting: Podiatry

## 2021-08-08 ENCOUNTER — Other Ambulatory Visit: Payer: Self-pay

## 2021-08-08 DIAGNOSIS — M722 Plantar fascial fibromatosis: Secondary | ICD-10-CM

## 2021-08-08 NOTE — Progress Notes (Signed)
She presents today for physical therapy states that it has not been helpful at all states that the pain is still the same.  She like to consider surgical intervention because is not doing any better.  Objective: Vital signs are stable she is alert oriented x3 severe pain to palpation medial and central calcaneal tubercles of the right foot.  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  Plan: Discussed etiology pathology conservative therapies at this point time consented her for a endoscopic plantar fasciotomy total right foot.  PRP injection as well.

## 2021-08-13 ENCOUNTER — Other Ambulatory Visit: Payer: Self-pay

## 2021-08-13 ENCOUNTER — Telehealth: Payer: Self-pay | Admitting: Urology

## 2021-08-13 ENCOUNTER — Ambulatory Visit (INDEPENDENT_AMBULATORY_CARE_PROVIDER_SITE_OTHER): Payer: 59 | Admitting: Podiatry

## 2021-08-13 ENCOUNTER — Ambulatory Visit: Payer: 59

## 2021-08-13 DIAGNOSIS — M722 Plantar fascial fibromatosis: Secondary | ICD-10-CM

## 2021-08-13 DIAGNOSIS — F3340 Major depressive disorder, recurrent, in remission, unspecified: Secondary | ICD-10-CM | POA: Insufficient documentation

## 2021-08-13 DIAGNOSIS — E039 Hypothyroidism, unspecified: Secondary | ICD-10-CM | POA: Insufficient documentation

## 2021-08-13 DIAGNOSIS — H3530 Unspecified macular degeneration: Secondary | ICD-10-CM | POA: Insufficient documentation

## 2021-08-13 DIAGNOSIS — G479 Sleep disorder, unspecified: Secondary | ICD-10-CM | POA: Insufficient documentation

## 2021-08-13 DIAGNOSIS — F339 Major depressive disorder, recurrent, unspecified: Secondary | ICD-10-CM | POA: Insufficient documentation

## 2021-08-13 DIAGNOSIS — F411 Generalized anxiety disorder: Secondary | ICD-10-CM | POA: Insufficient documentation

## 2021-08-13 NOTE — Telephone Encounter (Signed)
DOS - 08/23/21   EPF RIGHT --- [id number]   [employer] EFFECTIVE DATE - 07/22/19  SPOKE WITH MEL D. WITH [employer] AND HE STATED THAT FOR CPT CODE 60109 NO PRIOR AUTH IS REQUIRED.  REF # [id number]

## 2021-08-14 NOTE — H&P (View-Only) (Signed)
°  Subjective:  °Patient ID: Kimberly Lara, female    DOB: 12/24/1975,  MRN: [id number] ° °Chief Complaint  °Patient presents with  ° Plantar Fasciitis  °      SURGERY CONSULT/REF BY DR HYATT  ° ° °46 y.o. female presents with the above complaint. History confirmed with patient.  She is referred to me by Dr. Hyatt for endoscopic plantar fasciotomy.  She was scheduled for outpatient surgery at the surgery center however due to her weight they are unable to do it in an ambulatory surgical center..  This issue has been going on for quite some time for her and has not improved and she has failed all nonsurgical treatment including physical therapy and has had an MRI as well. ° °Objective:  °Physical Exam: °warm, good capillary refill, no trophic changes or ulcerative lesions, normal DP and PT pulses, and normal sensory exam. ° °Right Foot: She has sharp pain on palpation of the plantar medial insertion of the medial band of the plantar fascia as well as the central plantar lateral heel as well along the lateral band of the plantar fascial ° °No images are attached to the encounter. ° °Radiographs: °Multiple views x-ray of the right foot: Small plantar calcaneal enthesophyte ° °IMPRESSION: °1. Plantar fasciitis of the lateral band of the plantar fascia. °2. Mild tendinosis of the peroneus longus. °3. 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 °  °  °Electronically Signed °  By: Hetal  Patel M.D. °  On: 05/30/2021 16:44 °Assessment:  ° °1. Plantar fasciitis of right foot   ° ° ° °Plan:  °Patient was evaluated and treated and all questions answered. ° °I reviewed the MRI and radiographs with the patient in detail.  We discussed surgical and nonsurgical treatment of Planter fasciitis and I am confident she has exhausted all her reasonable options for nonsurgical treatment.  I reviewed the proposed surgical plan of endoscopic plantar fasciitis.   I discussed with her her primary risk factor for any complication or failure of the procedure would be weight otherwise I think she is a good surgical candidate she does not smoke and she works from home and will be able to rest and recuperate from work appropriately.  I discussed the postoperative protocol including the weightbearing.  In a cam boot which she has already been dispensed by Dr. Hyatt.  All risk benefits and potential complications were discussed.  All questions were addressed.  Informed consent was signed and reviewed and surgery will be  scheduled for the hospital setting at a mutually agreeable date.  We also discussed the option of platelet rich plasma injection and we will plan to do this at the same time if possible ° ° °Surgical plan: ° °Procedure: °-Endoscopic plantar fasciotomy of the right foot with PRP injection ° °Location: °-[facility] ° °Anesthesia plan: °-IV sedation with local field block ° °Postoperative pain plan: °- Tylenol 1000 mg every 6 hours, gabapentin 300 mg every 8 hours x5 days, oxycodone 5 mg 1-2 tabs every 6 hours only as needed ° °DVT prophylaxis: °-None required she will be WBAT in the CAM boot will be able to remove it and exercising the ankle up and down after surgery ° °WB Restrictions / DME needs: °-WBAT in CAM boot which she already has ° ° °No follow-ups on file.  ° °

## 2021-08-14 NOTE — Progress Notes (Signed)
°  Subjective:  Patient ID: Kimberly Lara, female    DOB: 1976-05-20,  MRN: [id number]  Chief Complaint  Patient presents with   Plantar Fasciitis        SURGERY CONSULT/REF BY DR HYATT    46 y.o. female presents with the above complaint. History confirmed with patient.  She is referred to me by Dr. Al Corpus for endoscopic plantar fasciotomy.  She was scheduled for outpatient surgery at the surgery center however due to her weight they are unable to do it in an ambulatory surgical center..  This issue has been going on for quite some time for her and has not improved and she has failed all nonsurgical treatment including physical therapy and has had an MRI as well.  Objective:  Physical Exam: warm, good capillary refill, no trophic changes or ulcerative lesions, normal DP and PT pulses, and normal sensory exam.  Right Foot: She has sharp pain on palpation of the plantar medial insertion of the medial band of the plantar fascia as well as the central plantar lateral heel as well along the lateral band of the plantar fascial  No images are attached to the encounter.  Radiographs: Multiple views x-ray of the right foot: Small plantar calcaneal enthesophyte  IMPRESSION: 1. Plantar fasciitis of the lateral band of the plantar fascia. 2. Mild tendinosis of the peroneus longus. 3. 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     Electronically Signed   By: Elige Ko M.D.   On: 05/30/2021 16:44 Assessment:   1. Plantar fasciitis of right foot      Plan:  Patient was evaluated and treated and all questions answered.  I reviewed the MRI and radiographs with the patient in detail.  We discussed surgical and nonsurgical treatment of Planter fasciitis and I am confident she has exhausted all her reasonable options for nonsurgical treatment.  I reviewed the proposed surgical plan of endoscopic plantar fasciitis.   I discussed with her her primary risk factor for any complication or failure of the procedure would be weight otherwise I think she is a good surgical candidate she does not smoke and she works from home and will be able to rest and recuperate from work appropriately.  I discussed the postoperative protocol including the weightbearing.  In a cam boot which she has already been dispensed by Dr. Al Corpus.  All risk benefits and potential complications were discussed.  All questions were addressed.  Informed consent was signed and reviewed and surgery will be  scheduled for the hospital setting at a mutually agreeable date.  We also discussed the option of platelet rich plasma injection and we will plan to do this at the same time if possible   Surgical plan:  Procedure: -Endoscopic plantar fasciotomy of the right foot with PRP injection  [facility] -[facility]  Anesthesia plan: -IV sedation with local field block  Postoperative pain plan: - Tylenol 1000 mg every 6 hours, gabapentin 300 mg every 8 hours x5 days, oxycodone 5 mg 1-2 tabs every 6 hours only as needed  DVT prophylaxis: -None required she will be WBAT in the CAM boot will be able to remove it and exercising the ankle up and down after surgery  WB Restrictions / DME needs: -WBAT in CAM boot which she already has   No follow-ups on file.

## 2021-08-15 ENCOUNTER — Encounter: Payer: 59 | Admitting: Physical Therapy

## 2021-08-16 ENCOUNTER — Ambulatory Visit: Payer: 59

## 2021-08-20 ENCOUNTER — Ambulatory Visit: Payer: 59 | Admitting: Physical Therapy

## 2021-08-21 ENCOUNTER — Encounter (HOSPITAL_COMMUNITY): Payer: Self-pay | Admitting: *Deleted

## 2021-08-21 ENCOUNTER — Other Ambulatory Visit: Payer: Self-pay

## 2021-08-21 NOTE — Progress Notes (Addendum)
PCP - Aliene Beams Cardiologist - denies EKG - recently at PCP office per pt Jan 2023, unable to see tracing in Care Everywhere Chest x-ray - denies ECHO - denies Cardiac Cath - denies CPAP - Doesn't wear, OSA diagnosed from sleep study in [place] >20years ago Fasting Blood Sugar:  n/a Checks Blood Sugar:  n/a Blood Thinner Instructions: n/a Aspirin Instructions: n/a ERAS Protcol - clears until 7:30am COVID TEST- n/a ambulatory, states she was Positive for Covid Jul 14, 2021  Anesthesia review: no  -------------  SDW INSTRUCTIONS:  Your procedure is scheduled on Friday, Feb 3. Please report to [facility] Main Entrance "A" at 8:00 A.M., and check in at the Admitting office. Call this number if you have problems the morning of surgery: [phone]   Remember: Do not eat after midnight the night before your surgery  You may drink clear liquids until 7:30 the morning of your surgery.   Clear liquids allowed are: Water, Non-Citrus Juices (without pulp), Carbonated Beverages, Clear Tea, Black Coffee Only, and Gatorade   Medications to take morning of surgery with a sip of water include: Synthroid Venlafaxine  As of today, STOP taking any Aspirin (unless otherwise instructed by your surgeon), Aleve, Naproxen, Ibuprofen, Motrin, Advil, Goody's, BC's, all herbal medications, fish oil, and all vitamins.    The Morning of Surgery Do not wear jewelry, make-up or nail polish. Do not wear lotions, powders, or perfumes/colognes, or deodorant Do not bring valuables to the hospital. [facility] is not responsible for any belongings or valuables.  If you are a smoker, DO NOT Smoke 24 hours prior to surgery  If you wear a CPAP at night please bring your mask the morning of surgery   Remember that you must have someone to transport you home after your surgery, and remain with you for 24 hours if you are discharged the same day.  Please bring cases for contacts, glasses, hearing aids,  dentures or bridgework because it cannot be worn into surgery.   Patients discharged the day of surgery will not be allowed to drive home.   Please shower the NIGHT BEFORE/MORNING OF SURGERY (use antibacterial soap like DIAL soap if possible). Wear comfortable clothes the morning of surgery. Oral Hygiene is also important to reduce your risk of infection.  Remember - BRUSH YOUR TEETH THE MORNING OF SURGERY WITH YOUR REGULAR TOOTHPASTE  Patient denies shortness of breath, fever, cough and chest pain.

## 2021-08-21 NOTE — Progress Notes (Signed)
Called Dr. Vara Guardian office, spoke with Morrie Sheldon. Requested surgical orders and recent H&P to be faxed to [phone]. Will send H&P, but states that Dr. Lilian Kapur is out of the office until Feb 3 and is unable to place orders.

## 2021-08-22 ENCOUNTER — Encounter: Payer: 59 | Admitting: Physical Therapy

## 2021-08-22 NOTE — Anesthesia Preprocedure Evaluation (Addendum)
Anesthesia Evaluation  Patient identified by MRN, date of birth, ID band Patient awake    Reviewed: Allergy & Precautions, NPO status , Patient's Chart, lab work & pertinent test results  Airway Mallampati: II  TM Distance: >3 FB Neck ROM: Full    Dental no notable dental hx. (+) Teeth Intact, Dental Advisory Given   Pulmonary neg pulmonary ROS,    Pulmonary exam normal breath sounds clear to auscultation       Cardiovascular Exercise Tolerance: Good Normal cardiovascular exam Rhythm:Regular Rate:Normal     Neuro/Psych negative neurological ROS     GI/Hepatic negative GI ROS, Neg liver ROS,   Endo/Other  Hypothyroidism Morbid obesity (BMI 77.5)  Renal/GU negative Renal ROS     Musculoskeletal   Abdominal   Peds  Hematology   Anesthesia Other Findings   Reproductive/Obstetrics                            Anesthesia Physical Anesthesia Plan  ASA: 3  Anesthesia Plan: Regional   Post-op Pain Management:    Induction:   PONV Risk Score and Plan: Treatment may vary due to age or medical condition, Midazolam and Ondansetron  Airway Management Planned: Natural Airway and Simple Face Mask  Additional Equipment: None  Intra-op Plan:   Post-operative Plan:   Informed Consent: I have reviewed the patients History and Physical, chart, labs and discussed the procedure including the risks, benefits and alternatives for the proposed anesthesia with the patient or authorized representative who has indicated his/her understanding and acceptance.     Dental advisory given  Plan Discussed with: CRNA and Anesthesiologist  Anesthesia Plan Comments: (? R pop block)       Anesthesia Quick Evaluation

## 2021-08-23 ENCOUNTER — Other Ambulatory Visit: Payer: Self-pay

## 2021-08-23 ENCOUNTER — Ambulatory Visit (HOSPITAL_COMMUNITY)
Admission: RE | Admit: 2021-08-23 | Discharge: 2021-08-23 | Disposition: A | Discharge status: Home / Self Care | Payer: 59 | Attending: Podiatry | Admitting: Podiatry

## 2021-08-23 ENCOUNTER — Encounter (HOSPITAL_COMMUNITY): Payer: Self-pay | Admitting: Podiatry

## 2021-08-23 ENCOUNTER — Ambulatory Visit (HOSPITAL_COMMUNITY): Payer: 59 | Admitting: Anesthesiology

## 2021-08-23 ENCOUNTER — Encounter (HOSPITAL_COMMUNITY)
Admission: RE | Disposition: A | Discharge status: Home / Self Care | Payer: Self-pay | Source: Home / Self Care | Attending: Podiatry

## 2021-08-23 DIAGNOSIS — M722 Plantar fascial fibromatosis: Secondary | ICD-10-CM

## 2021-08-23 DIAGNOSIS — Z6841 Body Mass Index (BMI) 40.0 and over, adult: Secondary | ICD-10-CM | POA: Diagnosis not present

## 2021-08-23 HISTORY — DX: Sleep apnea, unspecified: G47.30

## 2021-08-23 HISTORY — PX: PLANTAR FASCIA RELEASE: SHX2239

## 2021-08-23 HISTORY — DX: Anemia, unspecified: D64.9

## 2021-08-23 HISTORY — DX: Anxiety disorder, unspecified: F41.9

## 2021-08-23 HISTORY — DX: Disorder of thyroid, unspecified: E07.9

## 2021-08-23 HISTORY — DX: Depression, unspecified: F32.A

## 2021-08-23 SURGERY — FASCIOTOMY, PLANTAR, ENDOSCOPIC
Anesthesia: Regional | Laterality: Right

## 2021-08-23 MED ORDER — DEXTROSE 5 % IV SOLN
INTRAVENOUS | Status: DC | PRN
  Administered 2021-08-23: 3 g via INTRAVENOUS

## 2021-08-23 MED ORDER — 0.9 % SODIUM CHLORIDE (POUR BTL) OPTIME
TOPICAL | Status: DC | PRN
  Administered 2021-08-23: 1000 mL

## 2021-08-23 MED ORDER — ONDANSETRON HCL 4 MG/2ML IJ SOLN: 4.0000 mg | Freq: Once | INTRAMUSCULAR | Status: DC | PRN

## 2021-08-23 MED ORDER — CEFAZOLIN IN SODIUM CHLORIDE 3-0.9 GM/100ML-% IV SOLN
INTRAVENOUS | Status: AC
  Filled 2021-08-23: qty 100

## 2021-08-23 MED ORDER — BUPIVACAINE HCL (PF) 0.5 % IJ SOLN
INTRAMUSCULAR | Status: AC
  Filled 2021-08-23: qty 30

## 2021-08-23 MED ORDER — FENTANYL CITRATE (PF) 100 MCG/2ML IJ SOLN
INTRAMUSCULAR | Status: AC
  Administered 2021-08-23: 100 ug via INTRAVENOUS
  Filled 2021-08-23: qty 2

## 2021-08-23 MED ORDER — BUPIVACAINE-EPINEPHRINE (PF) 0.5% -1:200000 IJ SOLN
INTRAMUSCULAR | Status: DC | PRN
  Administered 2021-08-23: 30 mL via PERINEURAL

## 2021-08-23 MED ORDER — ORAL CARE MOUTH RINSE: 15.0000 mL | Freq: Once | OROMUCOSAL | Status: AC

## 2021-08-23 MED ORDER — ONDANSETRON HCL 4 MG/2ML IJ SOLN
INTRAMUSCULAR | Status: DC | PRN
  Administered 2021-08-23: 4 mg via INTRAVENOUS

## 2021-08-23 MED ORDER — MIDAZOLAM HCL 2 MG/2ML IJ SOLN: 2.0000 mg | Freq: Once | INTRAMUSCULAR | Status: AC

## 2021-08-23 MED ORDER — OXYCODONE HCL 5 MG PO TABS: 5.0000 mg | ORAL_TABLET | Freq: Once | ORAL | Status: DC | PRN

## 2021-08-23 MED ORDER — CEPHALEXIN 500 MG PO CAPS: 500.0000 mg | ORAL_CAPSULE | Freq: Three times a day (TID) | ORAL | 0 refills | Status: AC

## 2021-08-23 MED ORDER — OXYCODONE HCL 5 MG/5ML PO SOLN: 5.0000 mg | Freq: Once | ORAL | Status: DC | PRN

## 2021-08-23 MED ORDER — LACTATED RINGERS IV SOLN: INTRAVENOUS | Status: DC

## 2021-08-23 MED ORDER — CHLORHEXIDINE GLUCONATE 0.12 % MT SOLN
OROMUCOSAL | Status: AC
  Administered 2021-08-23: 15 mL via OROMUCOSAL
  Filled 2021-08-23: qty 15

## 2021-08-23 MED ORDER — OXYCODONE HCL 5 MG PO TABS: 5.0000 mg | ORAL_TABLET | ORAL | 0 refills | Status: AC | PRN

## 2021-08-23 MED ORDER — FENTANYL CITRATE (PF) 100 MCG/2ML IJ SOLN: 100.0000 ug | Freq: Once | INTRAMUSCULAR | Status: AC

## 2021-08-23 MED ORDER — ACETAMINOPHEN 10 MG/ML IV SOLN: 1000.0000 mg | Freq: Once | INTRAVENOUS | Status: DC | PRN

## 2021-08-23 MED ORDER — BUPIVACAINE HCL (PF) 0.5 % IJ SOLN
INTRAMUSCULAR | Status: DC | PRN
  Administered 2021-08-23: 20 mL

## 2021-08-23 MED ORDER — HYDROMORPHONE HCL 1 MG/ML IJ SOLN: 0.2500 mg | INTRAMUSCULAR | Status: DC | PRN

## 2021-08-23 MED ORDER — ACETAMINOPHEN 500 MG PO TABS: 1000.0000 mg | ORAL_TABLET | Freq: Four times a day (QID) | ORAL | 0 refills | Status: AC | PRN

## 2021-08-23 MED ORDER — ONDANSETRON HCL 4 MG/2ML IJ SOLN
INTRAMUSCULAR | Status: AC
  Filled 2021-08-23: qty 2

## 2021-08-23 MED ORDER — CEFAZOLIN SODIUM-DEXTROSE 2-4 GM/100ML-% IV SOLN
INTRAVENOUS | Status: AC
  Filled 2021-08-23: qty 100

## 2021-08-23 MED ORDER — DEXMEDETOMIDINE (PRECEDEX) IN NS 20 MCG/5ML (4 MCG/ML) IV SYRINGE
PREFILLED_SYRINGE | INTRAVENOUS | Status: DC | PRN
  Administered 2021-08-23 (×2): 8 ug via INTRAVENOUS
  Administered 2021-08-23: 4 ug via INTRAVENOUS

## 2021-08-23 MED ORDER — CHLORHEXIDINE GLUCONATE 0.12 % MT SOLN: 15.0000 mL | Freq: Once | OROMUCOSAL | Status: AC

## 2021-08-23 MED ORDER — AMISULPRIDE (ANTIEMETIC) 5 MG/2ML IV SOLN: 10.0000 mg | Freq: Once | INTRAVENOUS | Status: DC | PRN

## 2021-08-23 MED ORDER — MIDAZOLAM HCL 2 MG/2ML IJ SOLN
INTRAMUSCULAR | Status: AC
  Administered 2021-08-23: 2 mg via INTRAVENOUS
  Filled 2021-08-23: qty 2

## 2021-08-23 SURGICAL SUPPLY — 48 items
APPLICATOR COTTON TIP 6 STRL (MISCELLANEOUS) IMPLANT
APPLICATOR COTTON TIP 6IN STRL (MISCELLANEOUS) IMPLANT
BAG COUNTER SPONGE SURGICOUNT (BAG) ×2 IMPLANT
BANDAGE ESMARK 6X9 LF (GAUZE/BANDAGES/DRESSINGS) IMPLANT
BENZOIN TINCTURE PRP APPL 2/3 (GAUZE/BANDAGES/DRESSINGS) ×1 IMPLANT
BLADE CLIPPER SURG (BLADE) IMPLANT
BLADE SURG 15 STRL LF DISP TIS (BLADE) ×1 IMPLANT
BLADE SURG 15 STRL SS (BLADE) ×1
BNDG ELASTIC 4X5.8 VLCR STR LF (GAUZE/BANDAGES/DRESSINGS) ×2 IMPLANT
BNDG ESMARK 4X9 LF (GAUZE/BANDAGES/DRESSINGS) ×1 IMPLANT
BNDG ESMARK 6X9 LF (GAUZE/BANDAGES/DRESSINGS)
BNDG GAUZE ELAST 4 BULKY (GAUZE/BANDAGES/DRESSINGS) ×1 IMPLANT
CHLORAPREP W/TINT 26 (MISCELLANEOUS) ×2 IMPLANT
COVER SURGICAL LIGHT HANDLE (MISCELLANEOUS) ×2 IMPLANT
DECANTER SPIKE VIAL GLASS SM (MISCELLANEOUS) IMPLANT
DRAPE U-SHAPE 47X51 STRL (DRAPES) ×2 IMPLANT
DRSG EMULSION OIL 3X3 NADH (GAUZE/BANDAGES/DRESSINGS) ×1 IMPLANT
DRSG PAD ABDOMINAL 8X10 ST (GAUZE/BANDAGES/DRESSINGS) ×4 IMPLANT
DRSG XEROFORM 1X8 (GAUZE/BANDAGES/DRESSINGS) ×1 IMPLANT
ELECT REM PT RETURN 9FT ADLT (ELECTROSURGICAL) ×2
ELECTRODE REM PT RTRN 9FT ADLT (ELECTROSURGICAL) ×1 IMPLANT
GAUZE SPONGE 4X4 12PLY STRL (GAUZE/BANDAGES/DRESSINGS) ×1 IMPLANT
GLOVE SRG 8 PF TXTR STRL LF DI (GLOVE) ×1 IMPLANT
GLOVE SURG LTX SZ7.5 (GLOVE) ×4 IMPLANT
GLOVE SURG UNDER POLY LF SZ8 (GLOVE) ×1
GOWN STRL REUS W/ TWL LRG LVL3 (GOWN DISPOSABLE) ×1 IMPLANT
GOWN STRL REUS W/ TWL XL LVL3 (GOWN DISPOSABLE) ×2 IMPLANT
GOWN STRL REUS W/TWL LRG LVL3 (GOWN DISPOSABLE) ×1
GOWN STRL REUS W/TWL XL LVL3 (GOWN DISPOSABLE) ×2
HOLDER KNEE FOAM BLUE (MISCELLANEOUS) IMPLANT
KIT BASIN OR (CUSTOM PROCEDURE TRAY) ×2 IMPLANT
KIT PLATELET ACP SERIES I (KITS) ×1 IMPLANT
KIT TURNOVER KIT B (KITS) ×2 IMPLANT
NEEDLE HYPO 22GX1.5 SAFETY (NEEDLE) IMPLANT
NS IRRIG 1000ML POUR BTL (IV SOLUTION) ×2 IMPLANT
PACK ORTHO EXTREMITY (CUSTOM PROCEDURE TRAY) ×2 IMPLANT
PAD ARMBOARD 7.5X6 YLW CONV (MISCELLANEOUS) ×4 IMPLANT
PAD CAST 4YDX4 CTTN HI CHSV (CAST SUPPLIES) ×1 IMPLANT
PADDING CAST COTTON 4X4 STRL (CAST SUPPLIES)
SPONGE T-LAP 4X18 ~~LOC~~+RFID (SPONGE) ×2 IMPLANT
STRIP CLOSURE SKIN 1/2X4 (GAUZE/BANDAGES/DRESSINGS) ×1 IMPLANT
SUT ETHILON 3 0 PS 1 (SUTURE) ×1 IMPLANT
SUT ETHILON 4 0 PS 2 18 (SUTURE) IMPLANT
SUT VIC AB 3-0 FS2 27 (SUTURE) ×1 IMPLANT
SYR CONTROL 10ML LL (SYRINGE) IMPLANT
TOWEL GREEN STERILE (TOWEL DISPOSABLE) ×2 IMPLANT
TOWEL GREEN STERILE FF (TOWEL DISPOSABLE) ×2 IMPLANT
WATER STERILE IRR 1000ML POUR (IV SOLUTION) ×2 IMPLANT

## 2021-08-23 NOTE — Interval H&P Note (Signed)
History and Physical Interval Note:  08/23/2021 10:14 AM  Kimberly Lara  has presented today for surgery, with the diagnosis of PLANTAR FACIATIS RIGHT FOOT.  The various methods of treatment have been discussed with the patient and family. After consideration of risks, benefits and other options for treatment, the patient has consented to  Procedure(s): ENDOSCOPIC PLANTAR FASCIOTOMY (Right) as a surgical intervention.  The patient's history has been reviewed, patient examined, no change in status, stable for surgery.  I have reviewed the patient's chart and labs.  Questions were answered to the patient's satisfaction.     Edwin Cap

## 2021-08-23 NOTE — Anesthesia Postprocedure Evaluation (Signed)
Anesthesia Post Note  Patient: Kimberly Lara  Procedure(s) Performed: ENDOSCOPIC PLANTAR FASCIOTOMY WITH PLATELET RICH PLASMA INJECTION (Right)     Patient location during evaluation: PACU Anesthesia Type: Regional Level of consciousness: awake and alert Pain management: pain level controlled Vital Signs Assessment: post-procedure vital signs reviewed and stable Respiratory status: spontaneous breathing, nonlabored ventilation, respiratory function stable and patient connected to nasal cannula oxygen Cardiovascular status: blood pressure returned to baseline and stable Postop Assessment: no apparent nausea or vomiting Anesthetic complications: no   No notable events documented.  Last Vitals:  Vitals:   08/23/21 1249 08/23/21 1301  BP: 108/68 118/66  Pulse: 96 93  Resp: (!) 22 18  Temp:  (!) 36.2 C  SpO2: 94% 94%    Last Pain:  Vitals:   08/23/21 1301  TempSrc:   PainSc: 0-No pain                 Trevor Iha

## 2021-08-23 NOTE — Transfer of Care (Signed)
Immediate Anesthesia Transfer of Care Note  Patient: Kimberly Lara  Procedure(s) Performed: ENDOSCOPIC PLANTAR FASCIOTOMY WITH PLATELET RICH PLASMA INJECTION (Right)  Patient Location: PACU  Anesthesia Type:Regional  Level of Consciousness: awake, alert  and oriented  Airway & Oxygen Therapy: Patient Spontanous Breathing  Post-op Assessment: Post -op Vital signs reviewed and stable  Post vital signs: stable  Last Vitals:  Vitals Value Taken Time  BP    Temp    Pulse    Resp    SpO2      Last Pain:  Vitals:   08/23/21 0830  TempSrc:   PainSc: 5       Patients Stated Pain Goal: 3 (08/23/21 0830)  Complications: No notable events documented.

## 2021-08-23 NOTE — Brief Op Note (Signed)
08/23/2021  11:42 AM  PATIENT:  Kimberly Lara  46 y.o. female  PRE-OPERATIVE DIAGNOSIS:  PLANTAR FACIATIS RIGHT FOOT  POST-OPERATIVE DIAGNOSIS:  PLANTAR FACIATIS RIGHT FOOT  PROCEDURE:  Procedure(s): ENDOSCOPIC PLANTAR FASCIOTOMY WITH PLATELET RICH PLASMA INJECTION (Right)  SURGEON:  Surgeon(s) and Role:    * Broadus Costilla, Rachelle Hora, DPM - Primary  PHYSICIAN ASSISTANT:   ASSISTANTS: none   ANESTHESIA:   local and regional  EBL:  <76mL   BLOOD ADMINISTERED:none  DRAINS: none   LOCAL MEDICATIONS USED:  MARCAINE    and Amount: 20 ml  SPECIMEN:  No Specimen  DISPOSITION OF SPECIMEN:  N/A  COUNTS:  YES  TOURNIQUET:   Total Tourniquet Time Documented: Calf (Right) - 12 minutes Total: Calf (Right) - 12 minutes   DICTATION: .Note written in EPIC  PLAN OF CARE: Discharge to home after PACU  PATIENT DISPOSITION:  PACU - hemodynamically stable.   Delay start of Pharmacological VTE agent (>24hrs) due to surgical blood loss or risk of bleeding: not applicable

## 2021-08-23 NOTE — Anesthesia Procedure Notes (Signed)
Anesthesia Regional Block: Popliteal block   Pre-Anesthetic Checklist: , timeout performed,  Correct Patient, Correct Site, Correct Laterality,  Correct Procedure, Correct Position, site marked,  Risks and benefits discussed,  Pre-op evaluation,  At surgeon's request and post-op pain management  Laterality: Lower and Right  Prep: Maximum Sterile Barrier Precautions used, chloraprep       Needles:  Injection technique: Single-shot  Needle Type: Echogenic Needle     Needle Length: 9cm  Needle Gauge: 21     Additional Needles:   Procedures:,,,, ultrasound used (permanent image in chart),,    Narrative:  Start time: 08/23/2021 9:55 AM End time: 08/23/2021 10:05 AM Injection made incrementally with aspirations every 5 mL.  Performed by: Personally  Anesthesiologist: Trevor Iha, MD  Additional Notes: Block assessed. Patient tolerated procedure well.

## 2021-08-23 NOTE — H&P (Signed)
Anesthesia H&P Update: History and Physical Exam reviewed; patient is OK for planned anesthetic and procedure. ? ?

## 2021-08-23 NOTE — Discharge Instructions (Addendum)
Post-Surgery Instructions  1. If you are recuperating from surgery anywhere other than home, please be sure to leave [place] a number where you can be reached. 2. Go directly home and rest. 3. The keep operated foot (or feet) elevated six inches above the hip when sitting or lying down. 4. Support the elevated foot and leg with pillows under the calf. DO NOT PLACE PILLOWS UNDER THE KNEE. 5. DO NOT REMOVE or get your bandages wet. This will increase your chances of getting an infection. 6. Wear your surgical shoe at all times when you are up. 7. A limited amount of pain and swelling may occur. The skin may take on a bruised appearance. This is no cause for alarm. 8. For slight pain and swelling, apply an ice pack directly over the bandage for 15 minutes every hour. Continue icing until seen in the office. DO NOT apply any form of heat to the area. 9. Have prescription(s) filled immediately and take as directed. 10. Drink lots of liquids, water, and juice. 11. CALL THE OFFICE IMMEDIATELY IF: a. Bleeding continues b. Pain increases and/or does not respond to medication c. Bandage or cast appears too tight d. Any liquids (water, coffee, etc.) have spilled on your bandages. e. Tripping, falling, or stubbing the surgical foot f. If your temperature rises above 101 g. If you have ANY questions at all 12. Please use the crutches, knee scooter, or walker you have prescribed, rented, or purchased. If you are weight-bearing, follow your physicians instructions. You are expected to be: ? weight-bearing in your boot? non-weight bearing 13. Special Instructions: You may remove the boot when resting in bed or on the couch. Wear the boot when up and moving. It is good to move the ankle and toes up and down with the boot off. Do not take any NSAIDs (such as aspirin, motrin, aleve, etc)  14. Your next appointment is:  08/29/2021 2:00 PM Demontre Padin, Rachelle Hora, DPM    If you need to reach the nurse for any  reason, please call: [place]: [phone] [place]: [phone] [place]: [phone]

## 2021-08-24 ENCOUNTER — Encounter (HOSPITAL_COMMUNITY): Payer: Self-pay | Admitting: Podiatry

## 2021-08-24 DIAGNOSIS — M722 Plantar fascial fibromatosis: Secondary | ICD-10-CM

## 2021-08-24 NOTE — Op Note (Signed)
Patient Name: Kimberly Lara DOB: 05/22/1976  MRN: [id number]   Date of Service: 08/23/2021  Surgeon: Dr. Sharl Ma, DPM Assistants: None Pre-operative Diagnosis:  #1 plantar fasciitis right foot Post-operative Diagnosis:  #1 plantar fasciitis right foot Procedures:  1) endoscopic plantar fasciotomy right foot  2) platelet rich plasma injection of right foot Pathology/Specimens: * No specimens in log * Anesthesia: Regional block Hemostasis:  Total Tourniquet Time Documented: Calf (Right) - 12 minutes Total: Calf (Right) - 12 minutes  Estimated Blood Loss: Less than 5 mL Materials: * No implants in log * Medications: 20 cc Marcaine injected into the foot local field block Complications: No complications were noted  Indications for Procedure:  This is a 46 y.o. female with a history of severe plantar fasciitis of the right foot.  She underwent extensive nonsurgical treatment none of which was successful for her.  We discussed the risk benefits and potential complication prior to surgery.  She is aware that no guarantees can be made as to the outcome of the surgery.  Informed consent was signed and reviewed prior to surgery   Procedure in Detail: Patient was identified in pre-operative holding area. Formal consent was signed and the right lower extremity was marked.  She underwent regional block by the anesthesia team.  12 cc of whole blood was also harvested from a peripheral access and concentrated to 4 cc of platelet rich plasma. Patient was brought back to the operating room. Anesthesia was induced. The extremity was prepped and draped in the usual sterile fashion. Timeout was taken to confirm patient name, laterality, and procedure prior to incision.  The right lower extremity was elevated exsanguinated and the ankle tourniquet inflated to 250 mmHg  Attention was then directed to the right foot where a small incision was made at the glabrous junction just anterior to the insertion  of the plantar fascia on the medial calcaneus.  Blunt dissection was used through subcutaneous tissue and the elevator was used to create a plane just plantar to the plantar fascia which was easily palpable.  The obturator and cannula was then inserted and a counterincision was made on the lateral foot.  She did require an additional field block with 20 cc of Marcaine during the procedure for this, the block was ineffective.  The camera was inserted and the plantar fascia was readily identified.  The hook blade was used to transect the plantar fascia and a complete fashion through the medial central and lateral bands.  Once complete resection had been completed the incision was irrigated thoroughly.  The platelet rich plasma was then injected into the heel along the plantar fascia through its course and at its insertion.  The incisions were then closed with 3-0 nylon.  The foot was then dressed with Xeroform dry sterile dressings and an Ace wrap under light compression. Patient tolerated the procedure well.   Disposition: Following a period of post-operative monitoring, patient will be transferred to home.

## 2021-08-27 ENCOUNTER — Encounter: Payer: 59 | Admitting: Physical Therapy

## 2021-08-29 ENCOUNTER — Other Ambulatory Visit: Payer: Self-pay

## 2021-08-29 ENCOUNTER — Ambulatory Visit (INDEPENDENT_AMBULATORY_CARE_PROVIDER_SITE_OTHER): Payer: 59 | Admitting: Podiatry

## 2021-08-29 DIAGNOSIS — M722 Plantar fascial fibromatosis: Secondary | ICD-10-CM

## 2021-08-29 NOTE — Progress Notes (Signed)
°  Subjective:  Patient ID: Kimberly Lara, female    DOB: 1976-02-14,  MRN: [id number]  Chief Complaint  Patient presents with   Routine Post Op    POV #1 DOS 08/23/2021 RT EPF & PRP INJECTION     46 y.o. female returns for post-op check.  Having a good amount of pain.  The Ace wrap came off because get stuck on the Velcro of the CAM boot I pulled the dressing off she redressed it with a bandage herself.  Review of Systems: Negative except as noted in the HPI. Denies N/V/F/Ch.   Objective:  There were no vitals filed for this visit. There is no height or weight on file to calculate BMI. Constitutional Well developed. Well nourished.  Vascular Foot warm and well perfused. Capillary refill normal to all digits.  Calf is soft and supple, no posterior calf or knee pain, negative Homans' sign  Neurologic Normal speech. Oriented to person, place, and time. Epicritic sensation to light touch grossly present bilaterally.  Dermatologic Skin healing well without signs of infection. Skin edges well coapted without signs of infection.  Minor maceration lateral heel incision  Orthopedic: Tenderness to palpation noted about the surgical site.    Assessment:   1. Plantar fasciitis of right foot    Plan:  Patient was evaluated and treated and all questions answered.  S/p foot surgery right -Progressing as expected post-operatively. -WB Status: WBAT in CAM boot -Sutures: Removed at next visit. -Medications: No refills required -Foot redressed.  Steri-Strip applied to lateral incision.  In 1 week can remove dressing and bathe foot no soaking or scrubbing.  Return in about 2 weeks (around 09/12/2021) for post op (no x-rays), suture removal.

## 2021-09-03 ENCOUNTER — Ambulatory Visit: Payer: 59 | Admitting: Physical Therapy

## 2021-09-11 ENCOUNTER — Telehealth: Payer: Self-pay | Admitting: *Deleted

## 2021-09-11 MED ORDER — CEPHALEXIN 500 MG PO CAPS: 500.0000 mg | ORAL_CAPSULE | Freq: Three times a day (TID) | ORAL | 0 refills | Status: AC

## 2021-09-11 NOTE — Telephone Encounter (Signed)
Patient is calling because her outer ankle is extremely sore, red, barely can walk, thinks it may be infected.should an antibiotic be prescribed? Has an upcoming appointment on 09/12/21. Please advise.

## 2021-09-12 ENCOUNTER — Other Ambulatory Visit: Payer: Self-pay

## 2021-09-12 ENCOUNTER — Ambulatory Visit (INDEPENDENT_AMBULATORY_CARE_PROVIDER_SITE_OTHER): Payer: 59 | Admitting: Podiatry

## 2021-09-12 DIAGNOSIS — M722 Plantar fascial fibromatosis: Secondary | ICD-10-CM

## 2021-09-12 DIAGNOSIS — T8149XA Infection following a procedure, other surgical site, initial encounter: Secondary | ICD-10-CM

## 2021-09-12 NOTE — Telephone Encounter (Signed)
Patient seen today

## 2021-09-12 NOTE — Progress Notes (Signed)
°  Subjective:  Patient ID: Kimberly Lara, female    DOB: Apr 17, 1976,  MRN: [id number]  Chief Complaint  Patient presents with   Routine Post Op     POV #2 DOS 08/23/2021 RT EPF & PRP INJECTION     46 y.o. female returns for post-op check.  Pain was doing really well until about a day or 2 ago when the lateral incision became red tender and swollen has had some drainage.  She received the Rx for antibiotics yesterday but was not able to pick them up the pharmacy was closed.  Review of Systems: Negative except as noted in the HPI. Denies N/V/F/Ch.   Objective:  There were no vitals filed for this visit. There is no height or weight on file to calculate BMI. Constitutional Well developed. Well nourished.  Vascular Foot warm and well perfused. Capillary refill normal to all digits.  Calf is soft and supple, no posterior calf or knee pain, negative Homans' sign  Neurologic Normal speech. Oriented to person, place, and time. Epicritic sensation to light touch grossly present bilaterally.  Dermatologic Skin healing well without signs of infection. Skin edges well coapted without signs of infection.  Maceration has improved with a lateral heel incision there is erythema around the area no active drainage or fluctuance  Orthopedic: Tenderness to palpation noted about the surgical site.    Assessment:   1. Surgical site infection   2. Plantar fasciitis of right foot    Plan:  Patient was evaluated and treated and all questions answered.  S/p foot surgery right -Suspect she has a mild superficial site infection of the lateral incision.  Nothing to suggest today abscess or deep infection.  Has not been able to start her antibiotics yet but we confirmed while she was here today that the pharmacy has them and they will pick them up on the way home.  Sutures were left intact we will remove them at the next visit I will leave both incisions sutures intact until 3/7 we will remove it on that day.   Advised on signs and symptoms of worsening infection she should present to the ER immediately if these occur.  Also advised to continue to monitor her constitutional symptoms she has so far had no fevers chills nausea or vomiting.  Return in 12 days (on 09/24/2021) for suture removal.

## 2021-09-24 ENCOUNTER — Other Ambulatory Visit: Payer: Self-pay

## 2021-09-24 ENCOUNTER — Ambulatory Visit (INDEPENDENT_AMBULATORY_CARE_PROVIDER_SITE_OTHER): Payer: 59 | Admitting: Podiatry

## 2021-09-24 DIAGNOSIS — M722 Plantar fascial fibromatosis: Secondary | ICD-10-CM

## 2021-09-25 NOTE — Progress Notes (Signed)
?  Subjective:  ?Patient ID: Kimberly Lara, female    DOB: 10-31-1975,  MRN: [id number] ? ?Chief Complaint  ?Patient presents with  ? Routine Post Op  ?  suture removal.POV #3 DOS 08/23/2021 RT EPF & PRP INJECTION  ? ? ? ?46 y.o. female returns for post-op check.  Has improved quite a bit she is having almost no pain now.  She is been able to walk in the boot comfortably the sutures pulled out ? ?Review of Systems: Negative except as noted in the HPI. Denies N/V/F/Ch. ? ? ?Objective:  ?There were no vitals filed for this visit. ?There is no height or weight on file to calculate BMI. ?Constitutional Well developed. ?Well nourished.  ?Vascular Foot warm and well perfused. ?Capillary refill normal to all digits.  Calf is soft and supple, no posterior calf or knee pain, negative Homans' sign  ?Neurologic Normal speech. ?Oriented to person, place, and time. ?Epicritic sensation to light touch grossly present bilaterally.  ?Dermatologic Incisions are well-healed not hypertrophic no signs of infection remaining sutures have been removed  ?Orthopedic: She has minimal to no tenderness to palpation noted about the surgical site.  ? ? ?Assessment:  ? ?1. Plantar fasciitis of right foot   ? ?Plan:  ?Patient was evaluated and treated and all questions answered. ? ?S/p foot surgery right ?-Doing much better after taking antibiotics.  Infection has resolved at this point.  I discussed with her she may transition out of the cam boot over the next few weeks.  Restart physical therapy exercises to start moving the foot and decompress any scar tissue.  I will see her back in 3 weeks for follow-up ? ?Return in about 3 weeks (around 10/15/2021) for post op (no x-rays).  ? ?

## 2021-09-28 ENCOUNTER — Other Ambulatory Visit: Payer: Self-pay | Admitting: Podiatry

## 2021-09-30 ENCOUNTER — Encounter: Payer: 59 | Admitting: Podiatry

## 2021-10-03 ENCOUNTER — Encounter: Payer: 59 | Admitting: Podiatry

## 2021-10-15 ENCOUNTER — Ambulatory Visit (INDEPENDENT_AMBULATORY_CARE_PROVIDER_SITE_OTHER): Payer: 59 | Admitting: Podiatry

## 2021-10-15 ENCOUNTER — Other Ambulatory Visit: Payer: Self-pay

## 2021-10-15 DIAGNOSIS — M722 Plantar fascial fibromatosis: Secondary | ICD-10-CM

## 2021-10-18 ENCOUNTER — Encounter: Payer: Self-pay | Admitting: Podiatry

## 2021-10-18 NOTE — Progress Notes (Signed)
?  Subjective:  ?Patient ID: Kimberly Lara, female    DOB: 1976-04-24,  MRN: [id number] ? ?Chief Complaint  ?Patient presents with  ? Routine Post Op  ?  EPF right  ? ? ? ?46 y.o. female returns for post-op check.  Doing very well not having much pain at all now feels much better than it did prior to surgery ? ?Review of Systems: Negative except as noted in the HPI. Denies N/V/F/Ch. ? ? ?Objective:  ?There were no vitals filed for this visit. ?There is no height or weight on file to calculate BMI. ?Constitutional Well developed. ?Well nourished.  ?Vascular Foot warm and well perfused. ?Capillary refill normal to all digits.  Calf is soft and supple, no posterior calf or knee pain, negative Homans' sign  ?Neurologic Normal speech. ?Oriented to person, place, and time. ?Epicritic sensation to light touch grossly present bilaterally.  ?Dermatologic Incisions are well-healed some swelling here, skin is nonhypertrophic  ?Orthopedic: She has minimal to no tenderness to palpation noted about the surgical site.  ? ? ?Assessment:  ? ?1. Plantar fasciitis of right foot   ? ?Plan:  ?Patient was evaluated and treated and all questions answered. ? ?S/p foot surgery right ?-Overall doing well and is much better than she was prior to surgery.  She still has some thick skin that hopefully will reduce over time as a result of the infection she had postoperatively.  I will see her back as needed at this point ? ?Return if symptoms worsen or fail to improve.  ? ?

## 2021-12-18 ENCOUNTER — Encounter (HOSPITAL_BASED_OUTPATIENT_CLINIC_OR_DEPARTMENT_OTHER): Payer: Self-pay

## 2021-12-18 ENCOUNTER — Other Ambulatory Visit: Payer: Self-pay

## 2021-12-18 ENCOUNTER — Emergency Department (HOSPITAL_BASED_OUTPATIENT_CLINIC_OR_DEPARTMENT_OTHER)
Admission: EM | Admit: 2021-12-18 | Discharge: 2021-12-18 | Disposition: A | Discharge status: Home / Self Care | Payer: 59 | Attending: Emergency Medicine | Admitting: Emergency Medicine

## 2021-12-18 DIAGNOSIS — R32 Unspecified urinary incontinence: Secondary | ICD-10-CM | POA: Insufficient documentation

## 2021-12-18 DIAGNOSIS — R519 Headache, unspecified: Secondary | ICD-10-CM | POA: Diagnosis present

## 2021-12-18 DIAGNOSIS — G43909 Migraine, unspecified, not intractable, without status migrainosus: Secondary | ICD-10-CM | POA: Diagnosis not present

## 2021-12-18 MED ORDER — KETOROLAC TROMETHAMINE 30 MG/ML IJ SOLN
30.0000 mg | Freq: Once | INTRAMUSCULAR | Status: AC
  Administered 2021-12-18: 30 mg via INTRAMUSCULAR
  Filled 2021-12-18: qty 1

## 2021-12-18 MED ORDER — ONDANSETRON 4 MG PO TBDP
4.0000 mg | ORAL_TABLET | Freq: Once | ORAL | Status: AC
  Administered 2021-12-18: 4 mg via ORAL
  Filled 2021-12-18: qty 1

## 2021-12-18 MED ORDER — ONDANSETRON 4 MG PO TBDP: 4.0000 mg | ORAL_TABLET | Freq: Three times a day (TID) | ORAL | 0 refills | Status: DC | PRN

## 2021-12-18 NOTE — ED Provider Notes (Signed)
Emergency Department Provider Note   I have reviewed the triage vital signs and the nursing notes.   HISTORY  Chief Complaint Medication Reaction   HPI Kimberly Lara is a 46 y.o. female past medical history reviewed below presents emergency department for evaluation of urine incontinence and heart palpitations after taking Fioricet.  Patient states she has had headache intermittently over the past week.  She has history of migraines but typically does not have issues with them frequently. HA feels similar to prior migraine HA without new features. She has been treating at home with some relief but then symptoms returned.  She had a telehealth visit with her PCP and was prescribed Fioricet.  She states that she took this medication and 2 hours later felt heart palpitations and had an episode of urine incontinence.  There is no dysuria, hesitancy, urgency.  No abdominal or flank pain.  No fevers or chills.  No vaginal bleeding or discharge.  She did not experience rash, throat tightness, shortness of breath.  No chest discomfort.    Past Medical History:  Diagnosis Date   Anemia    Anxiety    Depression    Sleep apnea    Thyroid disease     Review of Systems  Constitutional: No fever/chills Eyes: No visual changes. ENT: No sore throat. Cardiovascular: Denies chest pain. Positive palpitations.  Respiratory: Denies shortness of breath. Gastrointestinal: No abdominal pain.  No nausea, no vomiting.  No diarrhea.  No constipation. Genitourinary: Negative for dysuria. Positive urine incontinence.  Musculoskeletal: Negative for back pain. Skin: Negative for rash. Neurological: Negative for headaches, focal weakness or numbness.   ____________________________________________   PHYSICAL EXAM:  VITAL SIGNS: ED Triage Vitals  Enc Vitals Group     BP 12/18/21 2145 (!) 148/92     Pulse Rate 12/18/21 2145 98     Resp 12/18/21 2145 20     Temp 12/18/21 2145 97.8 F (36.6 C)      Temp Source 12/18/21 2145 Oral     SpO2 12/18/21 2145 96 %     Weight 12/18/21 2151 (!) 470 lb (213.2 kg)     Height 12/18/21 2151 5\' 6"  (1.676 m)   Constitutional: Alert and oriented. Well appearing and in no acute distress. Eyes: Conjunctivae are normal.  Head: Atraumatic. Nose: No congestion/rhinnorhea. Mouth/Throat: Mucous membranes are moist.  Neck: No stridor.   Cardiovascular: Normal rate, regular rhythm. Good peripheral circulation. Grossly normal heart sounds.   Respiratory: Normal respiratory effort.  No retractions. Lungs CTAB. Gastrointestinal: Soft and nontender. No distention.  Musculoskeletal: No lower extremity tenderness nor edema. No gross deformities of extremities. Neurologic:  Normal speech and language. No gross focal neurologic deficits are appreciated.  Skin:  Skin is warm, dry and intact. No rash noted.  ____________________________________________  EKG   EKG Interpretation  Date/Time:  Wednesday Dec 18 2021 21:55:28 EDT Ventricular Rate:  97 PR Interval:  138 QRS Duration: 72 QT Interval:  320 QTC Calculation: 406 R Axis:   45 Text Interpretation: Normal sinus rhythm Anterior infarct , age undetermined Abnormal ECG No previous ECGs available Confirmed by 01-04-1981 [phone]) on 12/18/2021 10:56:30 PM       ____________________________________________   PROCEDURES  Procedure(s) performed:   Procedures  None  ____________________________________________   INITIAL IMPRESSION / ASSESSMENT AND PLAN / ED COURSE  Pertinent labs & imaging results that were available during my care of the patient were reviewed by me and considered in my medical decision making (see  chart for details).   This patient is Presenting for Evaluation of palpitations, which does require a range of treatment options, and is a complaint that involves a high risk of morbidity and mortality.  The Differential Diagnoses include medication reaction, arrhythmia, dehydration,  electrolyte disturbance.  Critical Interventions-    Medications  ketorolac (TORADOL) 30 MG/ML injection 30 mg (30 mg Intramuscular Given 12/18/21 2303)  ondansetron (ZOFRAN-ODT) disintegrating tablet 4 mg (4 mg Oral Given 12/18/21 2303)    Reassessment after intervention: HA improved.   Cardiac Monitor Tracing which shows NSR w/o ectopy.   Medical Decision Making: Summary:  Patient presents emergency department with heart palpitations and episode of urinary incontinence.  Symptoms began shortly after taking Fioricet.  No clear anaphylactic reaction on exam here.  EKG is reassuring.  Vital signs within normal limits.  Patient's headache is improved.  Doubt urinary tract infection without other symptoms of infection.  Vital signs are within normal limits.  Considered obtaining blood work and imaging but deferred based on reassuring exam and resolution of symptoms.  Advised patient to hold on taking additional doses of Fioricet and did place a referral in our system for her to follow-up with neurologist now that her migraine symptoms are increasing in frequency.  No red flag signs or symptoms to prompt advanced neuroimaging, lumbar puncture, other emergent headache work-up.    Disposition: discharge  ____________________________________________  FINAL CLINICAL IMPRESSION(S) / ED DIAGNOSES  Final diagnoses:  Migraine without status migrainosus, not intractable, unspecified migraine type     NEW OUTPATIENT MEDICATIONS STARTED DURING THIS VISIT:  Discharge Medication List as of 12/18/2021 11:03 PM     START taking these medications   Details  ondansetron (ZOFRAN-ODT) 4 MG disintegrating tablet Take 1 tablet (4 mg total) by mouth every 8 (eight) hours as needed., Starting Wed 12/18/2021, Normal        Note:  This document was prepared using Dragon voice recognition software and may include unintentional dictation errors.  Alona Bene, MD, [facility]    Kimerly Rowand, Arlyss Repress, MD 12/19/21 [phone]

## 2021-12-18 NOTE — ED Triage Notes (Signed)
Pt c/o HA 5 out of the last 7 days. Pt had a telemedicine visit and was Rx Fiorcet. After taking the medicine pt started having a racing heart and tingling to bilateral hands. Pt then had urinary incontinence. No focal neuro deficits noted in triage.

## 2021-12-18 NOTE — Discharge Instructions (Signed)

## 2021-12-23 ENCOUNTER — Encounter: Payer: Self-pay | Admitting: Psychiatry

## 2021-12-23 ENCOUNTER — Ambulatory Visit (INDEPENDENT_AMBULATORY_CARE_PROVIDER_SITE_OTHER): Payer: 59 | Admitting: Psychiatry

## 2021-12-23 ENCOUNTER — Other Ambulatory Visit: Payer: Self-pay | Admitting: Physician Assistant

## 2021-12-23 VITALS — BP 135/77 | HR 92 | Ht 66.0 in | Wt >= 6400 oz

## 2021-12-23 DIAGNOSIS — G43001 Migraine without aura, not intractable, with status migrainosus: Secondary | ICD-10-CM | POA: Diagnosis not present

## 2021-12-23 DIAGNOSIS — G43811 Other migraine, intractable, with status migrainosus: Secondary | ICD-10-CM

## 2021-12-23 MED ORDER — RIZATRIPTAN BENZOATE 10 MG PO TABS: ORAL_TABLET | ORAL | 6 refills | Status: AC

## 2021-12-23 MED ORDER — METHYLPREDNISOLONE 4 MG PO TBPK: ORAL_TABLET | ORAL | 0 refills | Status: AC

## 2021-12-23 NOTE — Progress Notes (Signed)
Referring:  Maia Plan, MD [address] [place],  [place] [address]  PCP: Aliene Beams, MD  Neurology was asked to evaluate Kimberly Lara, a 46 year old female for a chief complaint of headaches.  Our recommendations of care will be communicated by shared medical record.    CC:  headaches  History provided from self  HPI:  Medical co-morbidities: GAD, depression, hypothyroidism  The patient presents for evaluation of headaches which began Dec 12, 2021 and has been constant ever since. Notes she has had a lot of stress at work and had a panic attack the day before headache onset. Otherwise no clear inciting event. She has had 2-3 migraines in her life prior to this. Headache is described as occipital and frontal squeezing with associated photophobia, phonophobia, and nausea.   She initially tried Fioricet for rescue which did not help. Her PCP gave her Nurtec samples and prescribed Zofran which have helped. MRI/MRA were recently ordered and have not yet been scheduled.  Headache History: Onset: Dec 12, 2021 Triggers: computer screens Aura: no Location: bi-occipital, frontal Quality/Description: squeezing Associated Symptoms:  Photophobia: yes  Phonophobia: yes  Nausea: yes Worse with activity?: yes Duration of headaches: constant  Headache days per month: 30 Headache free days per month: 0  Current Treatment: Abortive Nurtec Zofran  Preventative none  Prior Therapies                                 Fioricet - palpitations Nurtec Zofran Toradol - lack of efficacy Effexor 300 mg daily   LABS: none  IMAGING:  none   Current Outpatient Medications on File Prior to Visit  Medication Sig Dispense Refill   cephALEXin (KEFLEX) 500 MG capsule Take 1 capsule (500 mg total) by mouth 3 (three) times daily. 30 capsule 0   meloxicam (MOBIC) 15 MG tablet TAKE 1 TABLET(15 MG) BY MOUTH DAILY 90 tablet 0   Multiple Vitamins-Minerals (PRESERVISION AREDS 2+MULTI VIT  PO) Take 1 capsule by mouth in the morning and at bedtime.     ondansetron (ZOFRAN-ODT) 4 MG disintegrating tablet Take 1 tablet (4 mg total) by mouth every 8 (eight) hours as needed. 20 tablet 0   Prenatal Vit-Fe Fumarate-FA (M-NATAL PLUS) 27-1 MG TABS Take 1 tablet by mouth daily.     SYNTHROID 100 MCG tablet Take 100 mcg by mouth daily before breakfast.     traZODone (DESYREL) 50 MG tablet Take 125 mg by mouth at bedtime as needed for sleep.     venlafaxine XR (EFFEXOR-XR) 150 MG 24 hr capsule Take 300 mg by mouth daily with breakfast.     No current facility-administered medications on file prior to visit.     Allergies: No Known Allergies  Family History: Migraine or other headaches in the family:  no Aneurysms in a first degree relative:  no Brain tumors in the family:  no Other neurological illness in the family:   no  Past Medical History: Past Medical History:  Diagnosis Date   Anemia    Anxiety    Depression    Sleep apnea    Thyroid disease     Past Surgical History Past Surgical History:  Procedure Laterality Date   ABDOMINAL HYSTERECTOMY  2018   CARPAL TUNNEL RELEASE Bilateral    CESAREAN SECTION     CHOLECYSTECTOMY     FOOT SURGERY Left    FOOT SURGERY Right  GASTRIC BYPASS  2009   PLANTAR FASCIA RELEASE Right 08/23/2021   Procedure: ENDOSCOPIC PLANTAR FASCIOTOMY WITH PLATELET RICH PLASMA INJECTION;  Surgeon: Edwin Cap, DPM;  Location: [facility] OR;  Service: Podiatry;  Laterality: Right;   TONSILLECTOMY     WISDOM TOOTH EXTRACTION     WRIST FRACTURE SURGERY Left     Social History: Social History   Tobacco Use   Smoking status: Never    Passive exposure: Never   Smokeless tobacco: Never  Vaping Use   Vaping Use: Never used  Substance Use Topics   Alcohol use: Not Currently   Drug use: Never    ROS: Negative for fevers, chills. Positive for headaches, nausea. All other systems reviewed and negative unless stated otherwise in  HPI.   Physical Exam:   Vital Signs: BP 135/77   Pulse 92   Ht 5\' 6"  (1.676 m)   Wt (!) 469 lb (212.7 kg)   BMI 75.70 kg/m  GENERAL: well appearing,in no acute distress,alert SKIN:  Color, texture, turgor normal. No rashes or lesions HEAD:  Normocephalic/atraumatic. CV:  RRR RESP: Normal respiratory effort MSK: +tenderness to palpation over bilateral shoulders  NEUROLOGICAL: Mental Status: Alert, oriented to person, place and time,Follows commands Cranial Nerves: PERRL, visual fields intact to confrontation, extraocular movements intact, facial sensation intact, no facial droop or ptosis, hearing grossly intact, no dysarthria Motor: muscle strength 5/5 both upper and lower extremities Reflexes: 2+ throughout Sensation: intact to light touch all 4 extremities Coordination: Finger-to- nose-finger intact bilaterally Gait: normal-based   IMPRESSION: 46 year old female with a history of GAD, depression, hypothyroidism who presents for evaluation of constant headaches since Dec 12, 2021. Her headache pattern is consistent with status migrainosus. Will prescribe a short course of steroids to help break her current headache cycle. If headaches do not break would consider starting propranolol for headache prevention which may also help with her panic attacks. Nurtec works well, but is unlikely to be covered by insurance without a prior trial of triptans. Will start Maxalt for rescue.  PLAN: -MRI/MRA brain ordered by PCP, will follow these up once completed -Medrol dosepak to help break headache cycle -Rescue: Start Maxalt 10 mg PRN  -next steps: consider propranolol for headache prevention   I spent a total of 27 minutes chart reviewing and counseling the patient. Headache education was done. Discussed treatment options including preventive and acute medications. Discussed medication overuse headache and to limit use of acute treatments to no more than 2 days/week or 10 days/month.  Discussed medication side effects, adverse reactions and drug interactions. Written educational materials and patient instructions outlining all of the above were given.  Follow-up: 5 months   Dec 14, 2021, MD 12/23/2021   1:59 PM

## 2021-12-23 NOTE — Patient Instructions (Signed)
Start Maxalt as neeeded for migraines. Take at the onset of migraine. If headache recurs or does not fully resolve, you may take a second dose after 2 hours. Please avoid taking more than 2 days per week or 10 days per month. Short course of steroids to help break your headache. Let me know if headache persists after this course of steroids and I will send in a daily medication to help prevent headaches.

## 2021-12-24 ENCOUNTER — Other Ambulatory Visit: Payer: Self-pay | Admitting: Physician Assistant

## 2021-12-24 ENCOUNTER — Encounter: Payer: Self-pay | Admitting: Psychiatry

## 2021-12-24 DIAGNOSIS — R748 Abnormal levels of other serum enzymes: Secondary | ICD-10-CM

## 2021-12-25 ENCOUNTER — Ambulatory Visit
Admission: RE | Admit: 2021-12-25 | Discharge: 2021-12-25 | Disposition: A | Discharge status: Home / Self Care | Payer: 59 | Source: Ambulatory Visit | Attending: Physician Assistant | Admitting: Physician Assistant

## 2021-12-25 DIAGNOSIS — R748 Abnormal levels of other serum enzymes: Secondary | ICD-10-CM

## 2021-12-30 ENCOUNTER — Encounter (INDEPENDENT_AMBULATORY_CARE_PROVIDER_SITE_OTHER): Payer: 59 | Admitting: Psychiatry

## 2021-12-30 DIAGNOSIS — G43001 Migraine without aura, not intractable, with status migrainosus: Secondary | ICD-10-CM

## 2021-12-31 ENCOUNTER — Other Ambulatory Visit: Payer: Self-pay | Admitting: Psychiatry

## 2021-12-31 MED ORDER — PROPRANOLOL HCL 20 MG PO TABS: 20.0000 mg | ORAL_TABLET | Freq: Two times a day (BID) | ORAL | 6 refills | Status: DC

## 2021-12-31 MED ORDER — NURTEC 75 MG PO TBDP: 75.0000 mg | ORAL_TABLET | ORAL | 6 refills | Status: DC | PRN

## 2021-12-31 MED ORDER — ONDANSETRON 4 MG PO TBDP: 4.0000 mg | ORAL_TABLET | Freq: Three times a day (TID) | ORAL | 6 refills | Status: DC | PRN

## 2021-12-31 MED ORDER — ONDANSETRON 4 MG PO TBDP: 4.0000 mg | ORAL_TABLET | Freq: Three times a day (TID) | ORAL | 6 refills | Status: AC | PRN

## 2021-12-31 MED ORDER — NURTEC 75 MG PO TBDP: 75.0000 mg | ORAL_TABLET | ORAL | 6 refills | Status: AC | PRN

## 2022-01-07 ENCOUNTER — Ambulatory Visit
Admission: RE | Admit: 2022-01-07 | Discharge: 2022-01-07 | Disposition: A | Discharge status: Home / Self Care | Payer: 59 | Source: Ambulatory Visit | Attending: Physician Assistant | Admitting: Physician Assistant

## 2022-01-07 DIAGNOSIS — G43811 Other migraine, intractable, with status migrainosus: Secondary | ICD-10-CM

## 2022-01-08 NOTE — Telephone Encounter (Signed)
Please see the MyChart message reply(ies) for my assessment and plan.    This patient gave consent for this Medical Advice Message and is aware that it may result in a bill to their insurance company, as well as the possibility of receiving a bill for a co-payment or deductible. They are an established patient, but are not seeking medical advice exclusively about a problem treated during an in person or video visit in the last seven days. I did not recommend an in person or video visit within seven days of my reply.    I spent a total of 10 minutes cumulative time within 7 days through MyChart messaging.  Maryanna Stuber, MD   

## 2022-01-13 ENCOUNTER — Other Ambulatory Visit: Payer: Self-pay | Admitting: Psychiatry

## 2022-01-13 ENCOUNTER — Encounter: Payer: Self-pay | Admitting: Psychiatry

## 2022-01-13 MED ORDER — TOPIRAMATE 25 MG PO TABS: ORAL_TABLET | ORAL | 6 refills | Status: AC

## 2022-01-14 ENCOUNTER — Telehealth: Payer: Self-pay

## 2022-01-14 NOTE — Telephone Encounter (Signed)
Dr. Delena Bali has agreed to write a 2 week excuse for pt to be out of work due to migraines. PW completed and signed by MD. Completed PW given to medical records for processing. PT has not paid for pw or filled out the party release forms. Medical records will be helping with this.

## 2022-01-16 DIAGNOSIS — Z0289 Encounter for other administrative examinations: Secondary | ICD-10-CM

## 2022-01-28 NOTE — Telephone Encounter (Signed)
[place], do we still have Zilla's paperwork from last month?

## 2022-01-28 NOTE — Telephone Encounter (Signed)
Per ok from Dr. Delena Bali ok to extend leave until 02/18/2022.  I have completed and added the extension to the forms and provided to referrals for processing. I cannot tell if the medical record release was completed on 01/13/2022 or if pt has paid for forms.  Referrals will investigate this further.

## 2022-02-14 ENCOUNTER — Encounter: Payer: Self-pay | Admitting: Psychiatry

## 2022-02-17 NOTE — Telephone Encounter (Signed)
No, she can have her sleep doctor or PCP extend FMLA for her CPAP

## 2022-02-18 NOTE — Telephone Encounter (Signed)
I'm happy to fill paperwork to clear her to go back to work if she needs. Otherwise she will need to ask her PCP or pulmonology to extend her leave

## 2022-02-18 NOTE — Telephone Encounter (Signed)
Yes, she should ask her PCP since this request is not for her migraines

## 2022-02-19 NOTE — Telephone Encounter (Signed)
She should continue her current medications for now. I forwarded the letter to you, I'm not sure how she wants it sent

## 2022-04-21 ENCOUNTER — Telehealth: Payer: Self-pay | Admitting: Psychiatry

## 2022-04-21 NOTE — Telephone Encounter (Signed)
LVM and sent mychart msg informing pt of r/s needed for 11/22 appt- office closing early.

## 2022-06-11 ENCOUNTER — Ambulatory Visit: Payer: 59 | Admitting: Psychiatry

## 2022-10-13 IMAGING — MR MR HEEL *R* W/O CM
4 of 5 series · 13 of 40 positions shown · non-contrast
Comparison: None.

CLINICAL DATA: No known injury, right heel pain

EXAM:
MR OF THE RIGHT HEEL WITHOUT CONTRAST
TECHNIQUE: Multiplanar, multisequence MR imaging of the right ankle was
performed. No intravenous contrast was administered.

[Series 3: PD fat-sat · axial · right · 3.0mm · 0.25mm/px · z∈[-82,+17]mm · 4 of 30 slices shown]
[im 1/30]
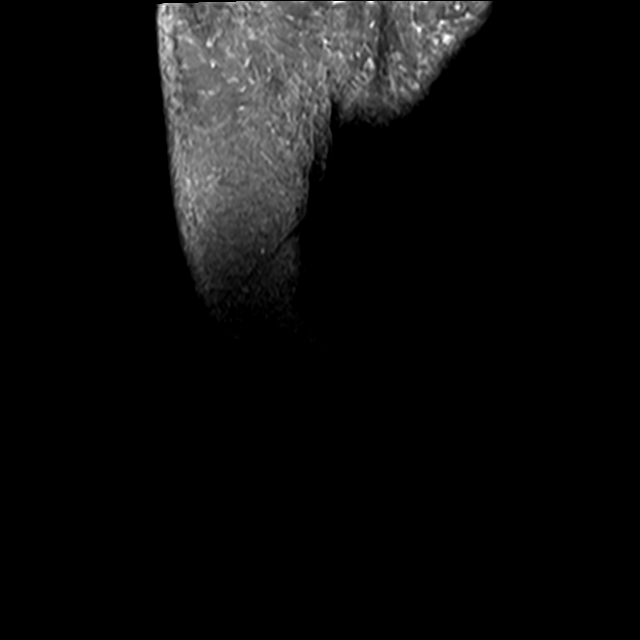
[im 4/30]
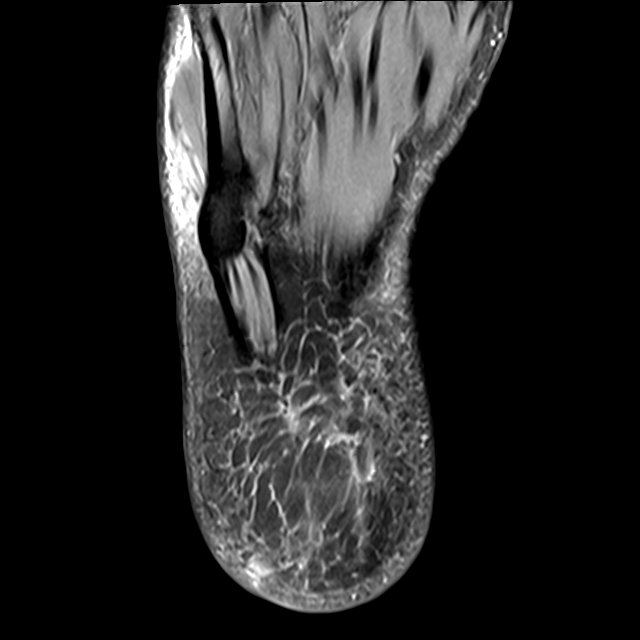
[im 15/30]
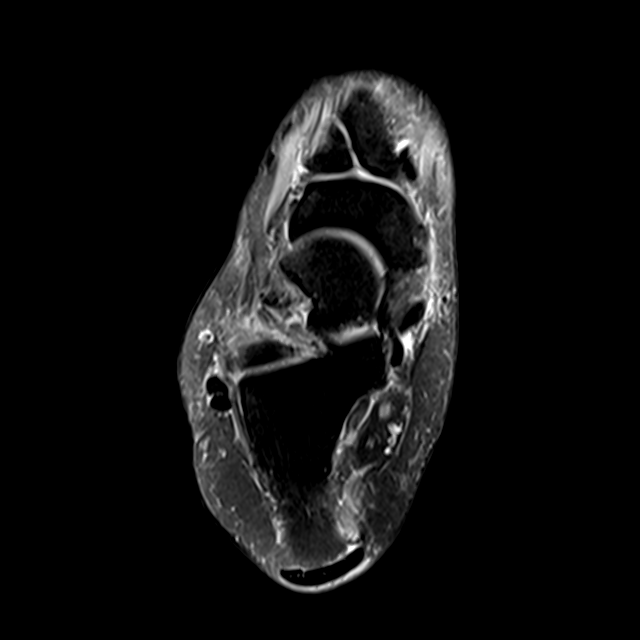
[im 26/30]
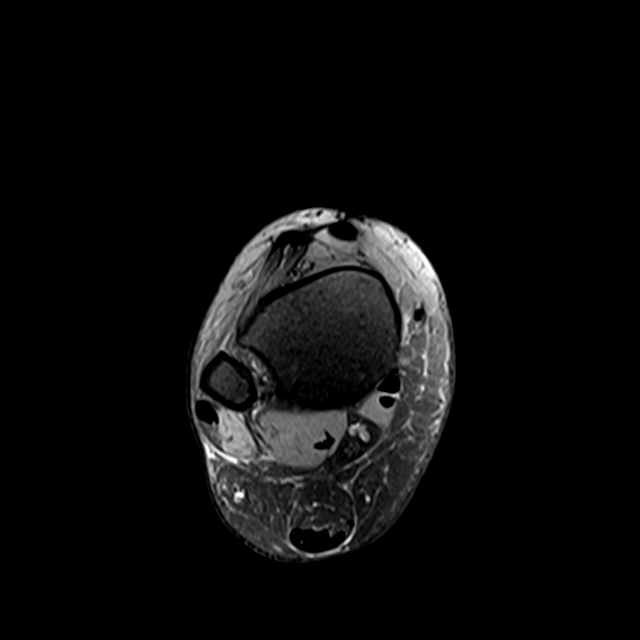

[Series 4: T2 fat-sat · axial · right · 3.0mm · 0.25mm/px · z∈[-66,+13]mm · 3 of 30 slices shown (1 of 2)]
[im 5/30]
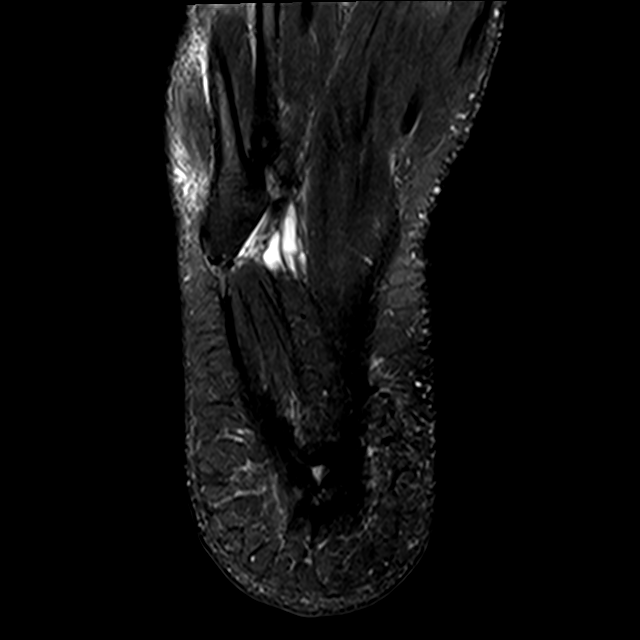
[im 17/30]
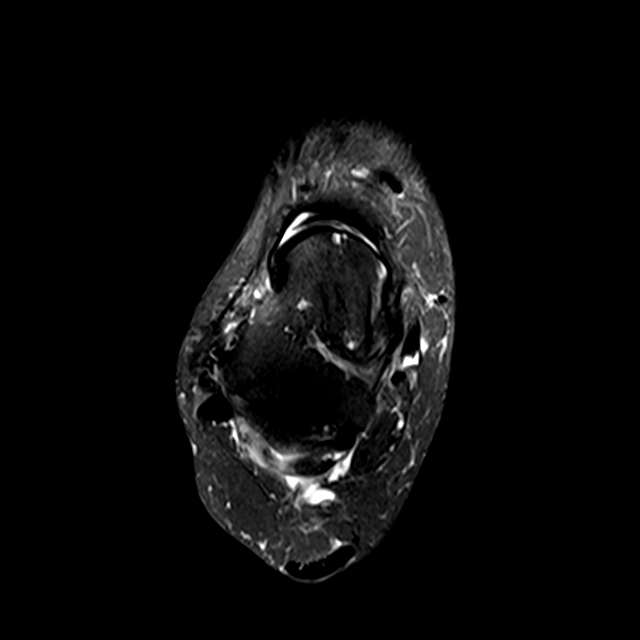
[im 25/30]
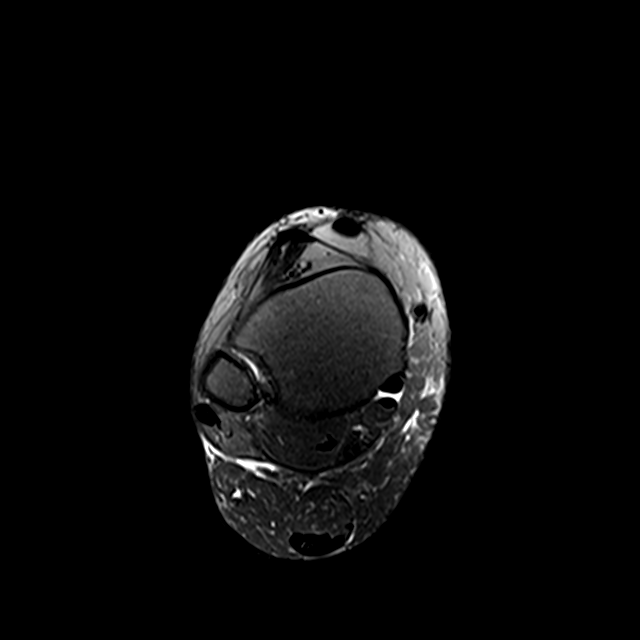

[Series 5: T1 · sagittal · right · 4.0mm · 0.27mm/px · 3 of 24 slices shown]
[im 5/24]
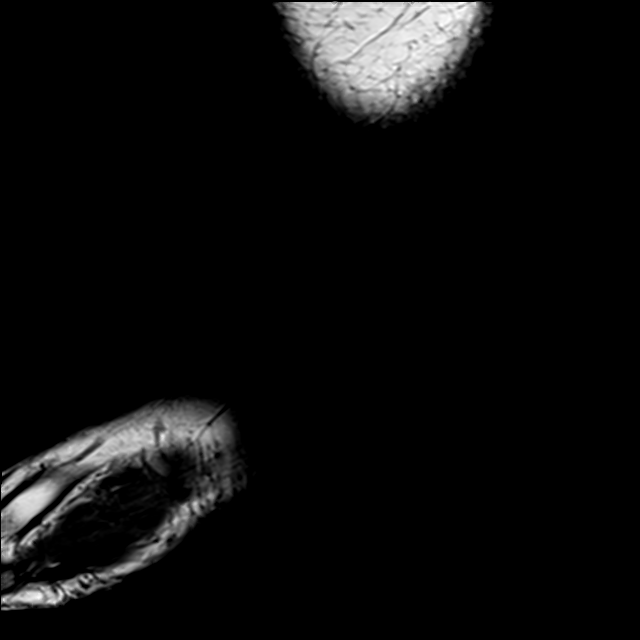
[im 14/24]
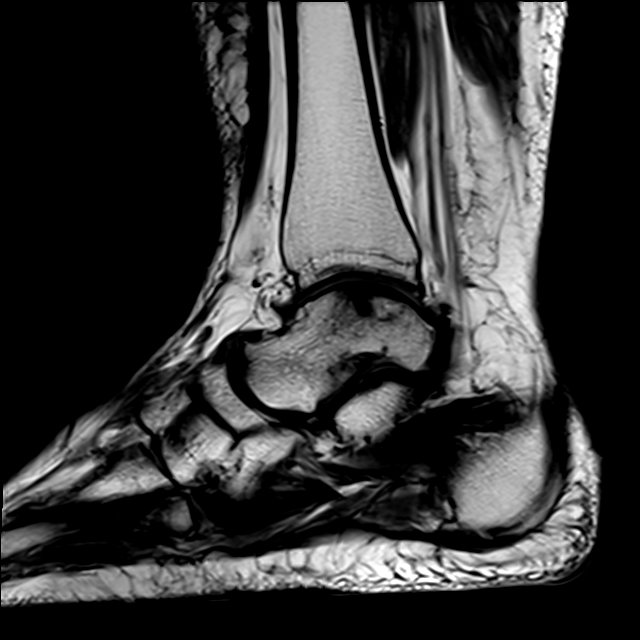
[im 24/24]
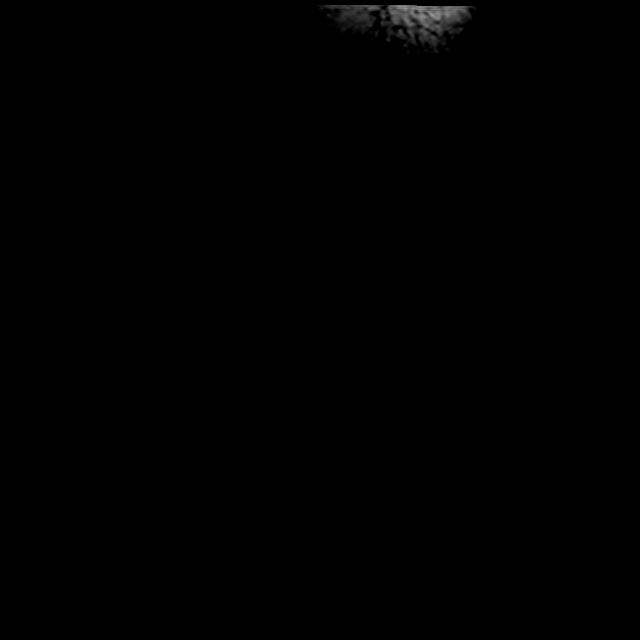

[Series 7: T2 fat-sat · coronal · right · 3.0mm · 0.25mm/px · 3 of 40 slices shown (2 of 2)]
[im 4/40]
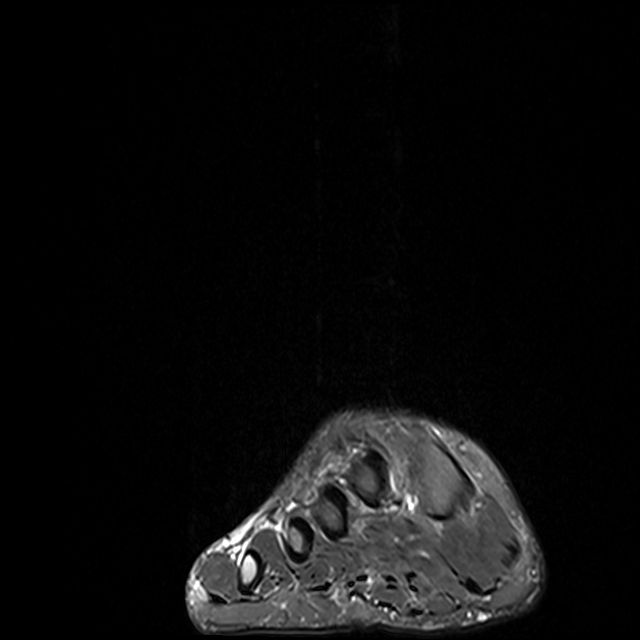
[im 20/40]
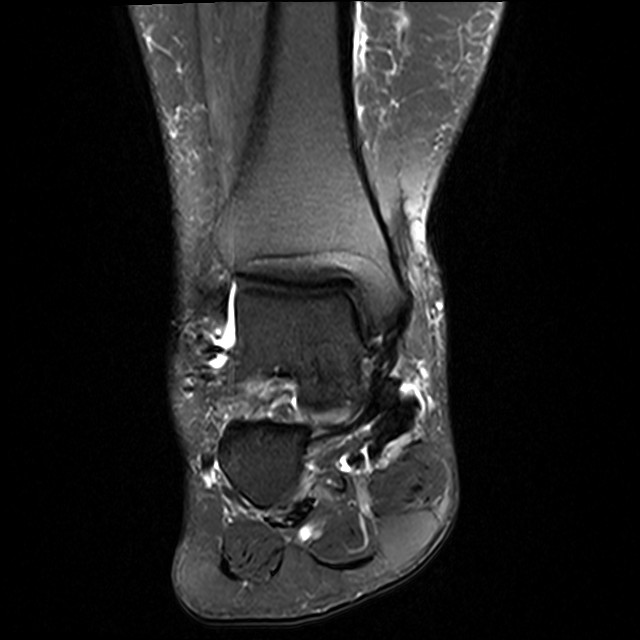
[im 36/40]
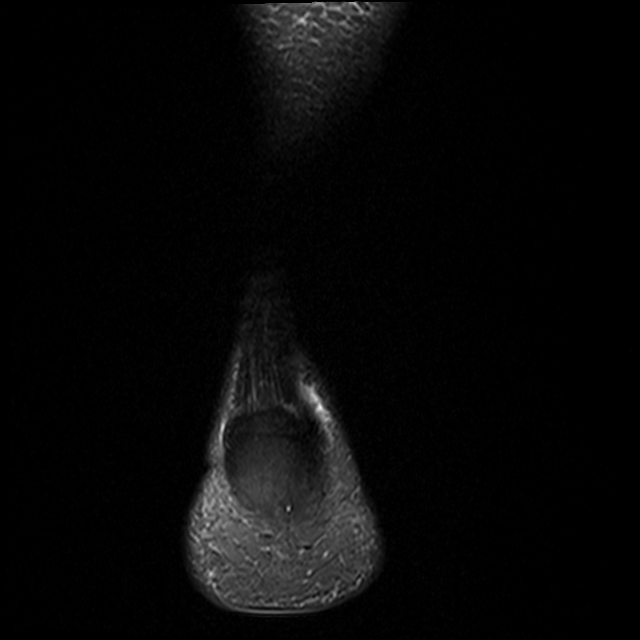

[13 of 40 positions shown; findings below may reference images not displayed]

FINDINGS: TENDONS

Peroneal: Mild tendinosis of the peroneus longus. Peroneal brevis
intact.

Posteromedial: Posterior tibial tendon intact. Flexor hallucis
longus tendon intact. Flexor digitorum longus tendon intact.

Anterior: Tibialis anterior tendon intact. Extensor hallucis longus
tendon intact Extensor digitorum longus tendon intact.

Achilles:  Mild tendinosis of the Achilles tendon.

Plantar Fascia: Increased signal and thickening of the lateral band
of the plantar fascia consistent with plantar fasciitis.

LIGAMENTS

Lateral: Prior anterior talofibular ligament repair. Calcaneofibular
ligament intact. Posterior talofibular ligament intact. Anterior and
posterior tibiofibular ligaments intact.

Medial: Deltoid ligament intact. Spring ligament intact.

CARTILAGE

Ankle Joint: No joint effusion. 1.4 x 1 cm osteochondral lesion of
the medial talar dome with partial-thickness cartilage loss and
subchondral cystic changes.

Subtalar Joints/Sinus Tarsi: Normal sinus tarsi. Mild osteoarthritis
of the middle subtalar joint.

Bones: Mild osteoarthritis of the talonavicular joint. No acute
fracture or dislocation.

Soft Tissue: No fluid collection or hematoma. Muscles are normal
without edema or atrophy. Tarsal tunnel is normal.
IMPRESSION: 1. Plantar fasciitis of the lateral band of the plantar fascia.
2. Mild tendinosis of the peroneus longus.
3. 1.4 x 1 cm osteochondral lesion of the medial talar dome with
partial-thickness cartilage loss and subchondral cystic changes.

## 2022-12-10 ENCOUNTER — Telehealth: Payer: Self-pay | Admitting: *Deleted

## 2022-12-10 NOTE — Telephone Encounter (Signed)
I recommend patient schedule a FU appt as planned. I don't see a note about the CTA in Dr. Quentin Mulling note, but pt was supposed to FU in 6 mo. She can be scheduled with Dr. Delena Bali next avail or NP next avail and a need for CTA can be discussed during the visit.

## 2022-12-10 NOTE — Telephone Encounter (Signed)
Called pt LVM of message nurse [facility] sent.

## 2022-12-10 NOTE — Telephone Encounter (Signed)
Dr. Frances Furbish, Dr. Delena Bali had this reminder in her inbasket for this pt: "Cta for aneurysm vs infundibulum"   You are work in, is this something you would want to go ahead and order?

## 2022-12-10 NOTE — Telephone Encounter (Signed)
Phone room: can you please call pt to schedule f/u with Dr. Delena Bali or NP? Thank you

## 2022-12-10 NOTE — Telephone Encounter (Signed)
Called pt. Informed her of message nurse [facility] sent. Pt stated my headaches has been under control and I don't need a appointment right now. I informed pt if anything change please call the office and schedule an appointment.

## 2022-12-10 NOTE — Telephone Encounter (Signed)
Phone room: looks like we do prescribe medication though. She will need to f/u yearly at least to continue to get refills. Dr. Delena Bali also wanted to discuss repeat imaging at an updated visit. Please call back and relay and schedule if pt agreeable after relaying

## 2023-10-26 ENCOUNTER — Other Ambulatory Visit (HOSPITAL_COMMUNITY): Payer: Self-pay | Admitting: Nurse Practitioner

## 2023-10-26 DIAGNOSIS — R748 Abnormal levels of other serum enzymes: Secondary | ICD-10-CM

## 2023-10-29 ENCOUNTER — Ambulatory Visit (HOSPITAL_BASED_OUTPATIENT_CLINIC_OR_DEPARTMENT_OTHER)
Admission: RE | Admit: 2023-10-29 | Discharge: 2023-10-29 | Disposition: A | Discharge status: Home / Self Care | Source: Ambulatory Visit | Attending: Nurse Practitioner | Admitting: Nurse Practitioner

## 2023-10-29 DIAGNOSIS — R748 Abnormal levels of other serum enzymes: Secondary | ICD-10-CM | POA: Insufficient documentation
